# Patient Record
Sex: Female | Born: 1945 | Race: White | Hispanic: No | Smoking: Never smoker
Health system: Southern US, Community
[De-identification: ages and names within clinical notes are randomized; demographics above are authoritative.]

## PROBLEM LIST (undated history)

## (undated) DIAGNOSIS — F329 Major depressive disorder, single episode, unspecified: Secondary | ICD-10-CM

## (undated) DIAGNOSIS — B029 Zoster without complications: Secondary | ICD-10-CM

## (undated) DIAGNOSIS — F419 Anxiety disorder, unspecified: Secondary | ICD-10-CM

## (undated) DIAGNOSIS — F32A Depression, unspecified: Secondary | ICD-10-CM

## (undated) DIAGNOSIS — M1990 Unspecified osteoarthritis, unspecified site: Secondary | ICD-10-CM

## (undated) DIAGNOSIS — R079 Chest pain, unspecified: Secondary | ICD-10-CM

## (undated) DIAGNOSIS — C801 Malignant (primary) neoplasm, unspecified: Secondary | ICD-10-CM

## (undated) DIAGNOSIS — R9431 Abnormal electrocardiogram [ECG] [EKG]: Secondary | ICD-10-CM

## (undated) HISTORY — PX: FOOT SURGERY: SHX648

## (undated) HISTORY — PX: CHOLECYSTECTOMY: SHX55

## (undated) HISTORY — PX: OTHER SURGICAL HISTORY: SHX169

## (undated) HISTORY — PX: EYE SURGERY: SHX253

## (undated) HISTORY — PX: SKIN CANCER EXCISION: SHX779

## (undated) HISTORY — PX: COLONOSCOPY: SHX174

## (undated) HISTORY — PX: GALLBLADDER SURGERY: SHX652

## (undated) HISTORY — PX: BUNIONECTOMY: SHX129

---

## 2004-09-05 ENCOUNTER — Ambulatory Visit: Payer: Self-pay

## 2006-10-29 ENCOUNTER — Ambulatory Visit: Payer: Self-pay

## 2007-12-09 ENCOUNTER — Ambulatory Visit: Payer: Self-pay

## 2008-05-20 ENCOUNTER — Inpatient Hospital Stay: Payer: Self-pay | Admitting: Surgery

## 2008-05-20 ENCOUNTER — Other Ambulatory Visit: Payer: Self-pay

## 2008-08-21 ENCOUNTER — Ambulatory Visit: Payer: Self-pay | Admitting: Unknown Physician Specialty

## 2009-12-24 ENCOUNTER — Ambulatory Visit: Payer: Self-pay

## 2011-08-18 ENCOUNTER — Ambulatory Visit: Payer: Self-pay

## 2012-11-01 ENCOUNTER — Ambulatory Visit: Payer: Self-pay | Admitting: Family Medicine

## 2013-07-25 ENCOUNTER — Ambulatory Visit: Payer: Self-pay

## 2013-08-22 ENCOUNTER — Ambulatory Visit: Payer: Self-pay | Admitting: Unknown Physician Specialty

## 2013-08-22 HISTORY — PX: SKIN CANCER EXCISION: SHX779

## 2013-08-22 LAB — HM COLONOSCOPY

## 2013-08-25 LAB — PATHOLOGY REPORT

## 2013-10-27 ENCOUNTER — Ambulatory Visit (INDEPENDENT_AMBULATORY_CARE_PROVIDER_SITE_OTHER): Payer: 59 | Admitting: Podiatry

## 2013-10-27 ENCOUNTER — Encounter: Payer: Self-pay | Admitting: Podiatry

## 2013-10-27 VITALS — BP 139/84 | HR 82 | Resp 16 | Ht 64.0 in | Wt 160.0 lb

## 2013-10-27 DIAGNOSIS — M722 Plantar fascial fibromatosis: Secondary | ICD-10-CM

## 2013-10-27 MED ORDER — MELOXICAM 15 MG PO TABS: 15.0000 mg | ORAL_TABLET | Freq: Every day | ORAL | Status: DC

## 2013-10-27 MED ORDER — METHYLPREDNISOLONE (PAK) 4 MG PO TABS: ORAL_TABLET | ORAL | Status: DC

## 2013-10-27 NOTE — Patient Instructions (Signed)
Plantar Fasciitis (Heel Spur Syndrome) with Rehab The plantar fascia is a fibrous, ligament-like, soft-tissue structure that spans the bottom of the foot. Plantar fasciitis is a condition that causes pain in the foot due to inflammation of the tissue. SYMPTOMS   Pain and tenderness on the underneath side of the foot.  Pain that worsens with standing or walking. CAUSES  Plantar fasciitis is caused by irritation and injury to the plantar fascia on the underneath side of the foot. Common mechanisms of injury include:  Direct trauma to bottom of the foot.  Damage to a small nerve that runs under the foot where the main fascia attaches to the heel bone.  Stress placed on the plantar fascia due to bone spurs. RISK INCREASES WITH:   Activities that place stress on the plantar fascia (running, jumping, pivoting, or cutting).  Poor strength and flexibility.  Improperly fitted shoes.  Tight calf muscles.  Flat feet.  Failure to warm-up properly before activity.  Obesity. PREVENTION  Warm up and stretch properly before activity.  Allow for adequate recovery between workouts.  Maintain physical fitness:  Strength, flexibility, and endurance.  Cardiovascular fitness.  Maintain a health body weight.  Avoid stress on the plantar fascia.  Wear properly fitted shoes, including arch supports for individuals who have flat feet. PROGNOSIS  If treated properly, then the symptoms of plantar fasciitis usually resolve without surgery. However, occasionally surgery is necessary. RELATED COMPLICATIONS   Recurrent symptoms that may result in a chronic condition.  Problems of the lower back that are caused by compensating for the injury, such as limping.  Pain or weakness of the foot during push-off following surgery.  Chronic inflammation, scarring, and partial or complete fascia tear, occurring more often from repeated injections. TREATMENT  Treatment initially involves the use of  ice and medication to help reduce pain and inflammation. The use of strengthening and stretching exercises may help reduce pain with activity, especially stretches of the Achilles tendon. These exercises may be performed at home or with a therapist. Your caregiver may recommend that you use heel cups of arch supports to help reduce stress on the plantar fascia. Occasionally, corticosteroid injections are given to reduce inflammation. If symptoms persist for greater than 6 months despite non-surgical (conservative), then surgery may be recommended.  MEDICATION   If pain medication is necessary, then nonsteroidal anti-inflammatory medications, such as aspirin and ibuprofen, or other minor pain relievers, such as acetaminophen, are often recommended.  Do not take pain medication within 7 days before surgery.  Prescription pain relievers may be given if deemed necessary by your caregiver. Use only as directed and only as much as you need.  Corticosteroid injections may be given by your caregiver. These injections should be reserved for the most serious cases, because they may only be given a certain number of times. HEAT AND COLD  Cold treatment (icing) relieves pain and reduces inflammation. Cold treatment should be applied for 10 to 15 minutes every 2 to 3 hours for inflammation and pain and immediately after any activity that aggravates your symptoms. Use ice packs or massage the area with a piece of ice (ice massage).  Heat treatment may be used prior to performing the stretching and strengthening activities prescribed by your caregiver, physical therapist, or athletic trainer. Use a heat pack or soak the injury in warm water. SEEK IMMEDIATE MEDICAL CARE IF:  Treatment seems to offer no benefit, or the condition worsens.  Any medications produce adverse side effects. EXERCISES RANGE   OF MOTION (ROM) AND STRETCHING EXERCISES - Plantar Fasciitis (Heel Spur Syndrome) These exercises may help you  when beginning to rehabilitate your injury. Your symptoms may resolve with or without further involvement from your physician, physical therapist or athletic trainer. While completing these exercises, remember:   Restoring tissue flexibility helps normal motion to return to the joints. This allows healthier, less painful movement and activity.  An effective stretch should be held for at least 30 seconds.  A stretch should never be painful. You should only feel a gentle lengthening or release in the stretched tissue. RANGE OF MOTION - Toe Extension, Flexion  Sit with your right / left leg crossed over your opposite knee.  Grasp your toes and gently pull them back toward the top of your foot. You should feel a stretch on the bottom of your toes and/or foot.  Hold this stretch for __________ seconds.  Now, gently pull your toes toward the bottom of your foot. You should feel a stretch on the top of your toes and or foot.  Hold this stretch for __________ seconds. Repeat __________ times. Complete this stretch __________ times per day.  RANGE OF MOTION - Ankle Dorsiflexion, Active Assisted  Remove shoes and sit on a chair that is preferably not on a carpeted surface.  Place right / left foot under knee. Extend your opposite leg for support.  Keeping your heel down, slide your right / left foot back toward the chair until you feel a stretch at your ankle or calf. If you do not feel a stretch, slide your bottom forward to the edge of the chair, while still keeping your heel down.  Hold this stretch for __________ seconds. Repeat __________ times. Complete this stretch __________ times per day.  STRETCH  Gastroc, Standing  Place hands on wall.  Extend right / left leg, keeping the front knee somewhat bent.  Slightly point your toes inward on your back foot.  Keeping your right / left heel on the floor and your knee straight, shift your weight toward the wall, not allowing your back to  arch.  You should feel a gentle stretch in the right / left calf. Hold this position for __________ seconds. Repeat __________ times. Complete this stretch __________ times per day. STRETCH  Soleus, Standing  Place hands on wall.  Extend right / left leg, keeping the other knee somewhat bent.  Slightly point your toes inward on your back foot.  Keep your right / left heel on the floor, bend your back knee, and slightly shift your weight over the back leg so that you feel a gentle stretch deep in your back calf.  Hold this position for __________ seconds. Repeat __________ times. Complete this stretch __________ times per day. STRETCH  Gastrocsoleus, Standing  Note: This exercise can place a lot of stress on your foot and ankle. Please complete this exercise only if specifically instructed by your caregiver.   Place the ball of your right / left foot on a step, keeping your other foot firmly on the same step.  Hold on to the wall or a rail for balance.  Slowly lift your other foot, allowing your body weight to press your heel down over the edge of the step.  You should feel a stretch in your right / left calf.  Hold this position for __________ seconds.  Repeat this exercise with a slight bend in your right / left knee. Repeat __________ times. Complete this stretch __________ times per day.    STRENGTHENING EXERCISES - Plantar Fasciitis (Heel Spur Syndrome)  These exercises may help you when beginning to rehabilitate your injury. They may resolve your symptoms with or without further involvement from your physician, physical therapist or athletic trainer. While completing these exercises, remember:   Muscles can gain both the endurance and the strength needed for everyday activities through controlled exercises.  Complete these exercises as instructed by your physician, physical therapist or athletic trainer. Progress the resistance and repetitions only as guided. STRENGTH - Towel  Curls  Sit in a chair positioned on a non-carpeted surface.  Place your foot on a towel, keeping your heel on the floor.  Pull the towel toward your heel by only curling your toes. Keep your heel on the floor.  If instructed by your physician, physical therapist or athletic trainer, add ____________________ at the end of the towel. Repeat __________ times. Complete this exercise __________ times per day. STRENGTH - Ankle Inversion  Secure one end of a rubber exercise band/tubing to a fixed object (table, pole). Loop the other end around your foot just before your toes.  Place your fists between your knees. This will focus your strengthening at your ankle.  Slowly, pull your big toe up and in, making sure the band/tubing is positioned to resist the entire motion.  Hold this position for __________ seconds.  Have your muscles resist the band/tubing as it slowly pulls your foot back to the starting position. Repeat __________ times. Complete this exercises __________ times per day.  Document Released: 09/01/2005 Document Revised: 11/24/2011 Document Reviewed: 12/14/2008 [employer] Patient Information 2014 [employer]. Plantar Fasciitis Plantar fasciitis is a common condition that causes foot pain. It is soreness (inflammation) of the band of tough fibrous tissue on the bottom of the foot that runs from the heel bone (calcaneus) to the ball of the foot. The cause of this soreness may be from excessive standing, poor fitting shoes, running on hard surfaces, being overweight, having an abnormal walk, or overuse (this is common in runners) of the painful foot or feet. It is also common in aerobic exercise dancers and ballet dancers. SYMPTOMS  Most people with plantar fasciitis complain of:  Severe pain in the morning on the bottom of their foot especially when taking the first steps out of bed. This pain recedes after a few minutes of walking.  Severe pain is experienced also during walking  following a long period of inactivity.  Pain is worse when walking barefoot or up stairs DIAGNOSIS   Your caregiver will diagnose this condition by examining and feeling your foot.  Special tests such as X-rays of your foot, are usually not needed. PREVENTION   Consult a sports medicine professional before beginning a new exercise program.  Walking programs offer a good workout. With walking there is a lower chance of overuse injuries common to runners. There is less impact and less jarring of the joints.  Begin all new exercise programs slowly. If problems or pain develop, decrease the amount of time or distance until you are at a comfortable level.  Wear good shoes and replace them regularly.  Stretch your foot and the heel cords at the back of the ankle (Achilles tendon) both before and after exercise.  Run or exercise on even surfaces that are not hard. For example, asphalt is better than pavement.  Do not run barefoot on hard surfaces.  If using a treadmill, vary the incline.  Do not continue to workout if you have foot or joint   problems. Seek professional help if they do not improve. HOME CARE INSTRUCTIONS   Avoid activities that cause you pain until you recover.  Use ice or cold packs on the problem or painful areas after working out.  Only take over-the-counter or prescription medicines for pain, discomfort, or fever as directed by your caregiver.  Soft shoe inserts or athletic shoes with air or gel sole cushions may be helpful.  If problems continue or become more severe, consult a sports medicine caregiver or your own health care provider. Cortisone is a potent anti-inflammatory medication that may be injected into the painful area. You can discuss this treatment with your caregiver. MAKE SURE YOU:   Understand these instructions.  Will watch your condition.  Will get help right away if you are not doing well or get worse. Document Released: 05/27/2001 Document  Revised: 11/24/2011 Document Reviewed: 07/26/2008 [facility] Patient Information 2014 [place], [place]. Plantar Fasciitis Plantar fasciitis is a common condition that causes foot pain. It is soreness (inflammation) of the band of tough fibrous tissue on the bottom of the foot that runs from the heel bone (calcaneus) to the ball of the foot. The cause of this soreness may be from excessive standing, poor fitting shoes, running on hard surfaces, being overweight, having an abnormal walk, or overuse (this is common in runners) of the painful foot or feet. It is also common in aerobic exercise dancers and ballet dancers. SYMPTOMS  Most people with plantar fasciitis complain of:  Severe pain in the morning on the bottom of their foot especially when taking the first steps out of bed. This pain recedes after a few minutes of walking.  Severe pain is experienced also during walking following a long period of inactivity.  Pain is worse when walking barefoot or up stairs DIAGNOSIS   Your caregiver will diagnose this condition by examining and feeling your foot.  Special tests such as X-rays of your foot, are usually not needed. PREVENTION   Consult a sports medicine professional before beginning a new exercise program.  Walking programs offer a good workout. With walking there is a lower chance of overuse injuries common to runners. There is less impact and less jarring of the joints.  Begin all new exercise programs slowly. If problems or pain develop, decrease the amount of time or distance until you are at a comfortable level.  Wear good shoes and replace them regularly.  Stretch your foot and the heel cords at the back of the ankle (Achilles tendon) both before and after exercise.  Run or exercise on even surfaces that are not hard. For example, asphalt is better than pavement.  Do not run barefoot on hard surfaces.  If using a treadmill, vary the incline.  Do not continue to workout if  you have foot or joint problems. Seek professional help if they do not improve. HOME CARE INSTRUCTIONS   Avoid activities that cause you pain until you recover.  Use ice or cold packs on the problem or painful areas after working out.  Only take over-the-counter or prescription medicines for pain, discomfort, or fever as directed by your caregiver.  Soft shoe inserts or athletic shoes with air or gel sole cushions may be helpful.  If problems continue or become more severe, consult a sports medicine caregiver or your own health care provider. Cortisone is a potent anti-inflammatory medication that may be injected into the painful area. You can discuss this treatment with your caregiver. MAKE SURE YOU:   Understand  these instructions.  Will watch your condition.  Will get help right away if you are not doing well or get worse. Document Released: 05/27/2001 Document Revised: 11/24/2011 Document Reviewed: 07/26/2008 [facility] Patient Information 2014 [place], [place].

## 2013-10-27 NOTE — Progress Notes (Signed)
Yvette Rogers presents today with a chief complaint of pain to her bilateral heels. She states this been going on for quite some time she denies fever chills nausea vomiting muscle aches and pains and trauma to the foot.  Objective: I have reviewed her past medical history medications allergies surgeries and social history. Pulses are palpable bilateral. She has pain on direct palpation medial continued tubercles bilaterally consistent with plantar fasciitis.  Assessment: Plantar fasciitis left greater than right.  Plan: Injected her left heel today. Sterapred Dosepak to be followed by Mobic was prescribed. Put her in a night splint in a plantar fascial strapping. Discussed appropriate shoe gear stretching exercises and ice therapy. I will followup with her in one month.

## 2013-11-24 ENCOUNTER — Ambulatory Visit: Payer: Self-pay | Admitting: Podiatry

## 2013-12-12 ENCOUNTER — Ambulatory Visit (INDEPENDENT_AMBULATORY_CARE_PROVIDER_SITE_OTHER): Payer: 59 | Admitting: Podiatry

## 2013-12-12 ENCOUNTER — Encounter: Payer: Self-pay | Admitting: Podiatry

## 2013-12-12 VITALS — BP 76/63 | HR 78 | Resp 16

## 2013-12-12 DIAGNOSIS — M722 Plantar fascial fibromatosis: Secondary | ICD-10-CM

## 2013-12-12 NOTE — Progress Notes (Signed)
She presents today for followup of her plantar fasciitis. She states that is approximately 60% better as she refers to the left foot.  Objective: Vital signs are stable she is alert and oriented x3 pulses are palpable bilateral. She has pain on palpation medial continued tubercle the left heel as well as the right heel now.  Assessment: Plantar fasciitis bilateral left greater than right.  Plan: Continue all conservative methods to decrease inflammation we also injected the bilateral heels today with Kenalog and local anesthetic.

## 2014-01-09 ENCOUNTER — Ambulatory Visit (INDEPENDENT_AMBULATORY_CARE_PROVIDER_SITE_OTHER): Payer: 59 | Admitting: Podiatry

## 2014-01-09 VITALS — Resp 16 | Ht 65.0 in | Wt 160.0 lb

## 2014-01-09 DIAGNOSIS — M722 Plantar fascial fibromatosis: Secondary | ICD-10-CM

## 2014-01-09 NOTE — Progress Notes (Signed)
She presents today for followup of her plantar fasciitis bilateral. She states that they're only burning a little bit now but it'll hurt quite as bad as they were.  Objective: Vital signs are stable she is alert and oriented x3. She has pain on palpation medial continued tubercles bilateral.  Assessment: Plantar fasciitis with neuritis bilateral.  Plan: She was scan today for orthotics and she will continue all conservative therapies. I will followup with her once those come in.

## 2014-01-20 ENCOUNTER — Encounter: Payer: Self-pay | Admitting: *Deleted

## 2014-01-20 NOTE — Progress Notes (Signed)
Sent pt post card letting her know orthotics are here. 

## 2014-01-30 ENCOUNTER — Ambulatory Visit (INDEPENDENT_AMBULATORY_CARE_PROVIDER_SITE_OTHER): Payer: 59 | Admitting: Podiatry

## 2014-01-30 ENCOUNTER — Encounter: Payer: Self-pay | Admitting: Podiatry

## 2014-01-30 DIAGNOSIS — M722 Plantar fascial fibromatosis: Secondary | ICD-10-CM

## 2014-01-30 NOTE — Patient Instructions (Signed)

## 2014-01-30 NOTE — Progress Notes (Signed)
Pt presents for orthotic pick up , written and verbal instructions giving

## 2014-03-06 ENCOUNTER — Ambulatory Visit: Payer: 59 | Admitting: Podiatry

## 2014-05-29 ENCOUNTER — Ambulatory Visit: Payer: 59 | Admitting: Podiatry

## 2014-05-29 VITALS — BP 132/80 | HR 84 | Resp 16

## 2014-05-29 DIAGNOSIS — M722 Plantar fascial fibromatosis: Secondary | ICD-10-CM

## 2014-05-29 DIAGNOSIS — M79609 Pain in unspecified limb: Secondary | ICD-10-CM

## 2014-05-29 DIAGNOSIS — M205X2 Other deformities of toe(s) (acquired), left foot: Secondary | ICD-10-CM

## 2014-05-29 DIAGNOSIS — M7989 Other specified soft tissue disorders: Secondary | ICD-10-CM

## 2014-05-29 DIAGNOSIS — M2010 Hallux valgus (acquired), unspecified foot: Secondary | ICD-10-CM

## 2014-05-29 DIAGNOSIS — M21619 Bunion of unspecified foot: Secondary | ICD-10-CM

## 2014-05-29 DIAGNOSIS — M205X9 Other deformities of toe(s) (acquired), unspecified foot: Secondary | ICD-10-CM

## 2014-05-29 DIAGNOSIS — M799 Soft tissue disorder, unspecified: Secondary | ICD-10-CM

## 2014-05-29 NOTE — Progress Notes (Signed)
She presents today for followup of her plantar fasciitis of her left foot and a painful bunion deformity of her left foot as well. She also states that she has an area overlying the dorsal aspect of the second toe which is painful particularly with flip flops and sandals. She's also having plate pain about the fifth metatarsophalangeal joints and she has a Taylor's bunion deformity bilateral foot. She denies any change in her past medical history medications allergies surgeries and social history. She denies any cardiac issues any new allergies.  Objective: Vital signs are stable she is alert and oriented x3. She has pain on palpation medial continued tubercle the left heel. She has pain on end range of motion of the first metatarsophalangeal joint of the left foot. 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 At this point she also has pain on palpation fifth metatarsal phalangeal joint bilateral with Taylor's bunion deformities. She has a soft tissue lesion that appears to extend down to the extensor tendon of the second toe. It is nonerythematous is nonedematous is nonpulsatile it does not feel as if it is a ganglion or anything of major concern other than tenderness.  Assessment: Pain in limb secondary to chronic intractable plantar fasciitis left. Hallux limitus first metatarsophalangeal joint left. Taylor's bunion deformities bilateral. Plantar fasciitis bilateral left greater than right soft tissue lesion dorsal aspect of the second toe left.  Plan: Discussed etiology pathology conservative versus surgical therapies. At this point surgical consent was performed consisting of a Baltazar Apo with osteotomy with screw. Fifth metatarsal osteotomies bilateral with screws. Excision soft tissue lesion dorsal aspect second toe left. Endoscopic plantar fasciotomy left foot. I went over consent form today line  bylined number by number giving her ample time to ask questions she saw fit regarding this procedure I answered them to the best of my ability in layman's terms she understood it was amenable to it and signed all 3 pages of the consent form. I will followup with her November for surgical intervention.

## 2014-06-01 ENCOUNTER — Telehealth: Payer: Self-pay | Admitting: *Deleted

## 2014-06-01 NOTE — Telephone Encounter (Signed)
Called and left message for pt to return my call because she would like to r/s surgery.

## 2014-06-20 DIAGNOSIS — M79609 Pain in unspecified limb: Secondary | ICD-10-CM

## 2014-06-20 LAB — LIPID PANEL
Cholesterol: 225 mg/dL — AB (ref 0–200)
HDL: 66 mg/dL (ref 35–70)
LDL Cholesterol: 131 mg/dL
LDl/HDL Ratio: 2
TRIGLYCERIDES: 141 mg/dL (ref 40–160)

## 2014-06-20 LAB — HEPATIC FUNCTION PANEL
ALT: 10 U/L (ref 7–35)
AST: 18 U/L (ref 13–35)
Alkaline Phosphatase: 90 U/L (ref 25–125)
BILIRUBIN, TOTAL: 0.3 mg/dL

## 2014-06-20 LAB — CBC AND DIFFERENTIAL
HCT: 39 % (ref 36–46)
Hemoglobin: 13.2 g/dL (ref 12.0–16.0)
Neutrophils Absolute: 3 /uL
PLATELETS: 249 10*3/uL (ref 150–399)
WBC: 5.2 10*3/mL

## 2014-06-20 LAB — BASIC METABOLIC PANEL
BUN: 12 mg/dL (ref 4–21)
Creatinine: 0.7 mg/dL (ref 0.5–1.1)
Glucose: 84 mg/dL
Potassium: 4.8 mmol/L (ref 3.4–5.3)
Sodium: 139 mmol/L (ref 137–147)

## 2014-06-20 LAB — TSH: TSH: 3.05 u[IU]/mL (ref 0.41–5.90)

## 2014-06-21 ENCOUNTER — Other Ambulatory Visit: Payer: Self-pay | Admitting: Podiatry

## 2014-06-21 MED ORDER — OXYCODONE-ACETAMINOPHEN 10-325 MG PO TABS: ORAL_TABLET | ORAL | Status: DC

## 2014-06-21 MED ORDER — PROMETHAZINE HCL 25 MG PO TABS: 25.0000 mg | ORAL_TABLET | Freq: Three times a day (TID) | ORAL | Status: DC | PRN

## 2014-06-21 MED ORDER — CLINDAMYCIN HCL 150 MG PO CAPS: 150.0000 mg | ORAL_CAPSULE | Freq: Three times a day (TID) | ORAL | Status: DC

## 2014-06-23 ENCOUNTER — Encounter: Payer: Self-pay | Admitting: Podiatry

## 2014-06-23 DIAGNOSIS — M722 Plantar fascial fibromatosis: Secondary | ICD-10-CM

## 2014-06-23 DIAGNOSIS — M2012 Hallux valgus (acquired), left foot: Secondary | ICD-10-CM

## 2014-06-23 DIAGNOSIS — S91302S Unspecified open wound, left foot, sequela: Secondary | ICD-10-CM

## 2014-06-23 DIAGNOSIS — M21542 Acquired clubfoot, left foot: Secondary | ICD-10-CM

## 2014-06-23 DIAGNOSIS — M21541 Acquired clubfoot, right foot: Secondary | ICD-10-CM

## 2014-06-28 ENCOUNTER — Ambulatory Visit (INDEPENDENT_AMBULATORY_CARE_PROVIDER_SITE_OTHER): Payer: 59 | Admitting: Podiatry

## 2014-06-28 ENCOUNTER — Ambulatory Visit (INDEPENDENT_AMBULATORY_CARE_PROVIDER_SITE_OTHER): Payer: 59

## 2014-06-28 VITALS — BP 112/75 | HR 85 | Temp 97.6°F | Resp 16

## 2014-06-28 DIAGNOSIS — M21611 Bunion of right foot: Secondary | ICD-10-CM

## 2014-06-28 DIAGNOSIS — Z9889 Other specified postprocedural states: Secondary | ICD-10-CM

## 2014-06-28 DIAGNOSIS — M2011 Hallux valgus (acquired), right foot: Secondary | ICD-10-CM

## 2014-06-28 MED ORDER — OXYCODONE-ACETAMINOPHEN 10-325 MG PO TABS: 1.0000 | ORAL_TABLET | Freq: Four times a day (QID) | ORAL | Status: DC | PRN

## 2014-06-28 MED ORDER — PROMETHAZINE HCL 25 MG PO TABS: 25.0000 mg | ORAL_TABLET | Freq: Three times a day (TID) | ORAL | Status: DC | PRN

## 2014-06-28 NOTE — Progress Notes (Signed)
Yvette Rogers presents today for postop visit date of surgery was 06/23/2014. She denies fever chills nausea vomiting muscle aches and pains. She presents today with a Cam Walker to her left foot and a Darco to the right foot.  Objective: Vital signs are stable she is alert and oriented x3 dry sterile dressing was intact once removed reveals fifth metatarsal osteotomy bilateral and EPF left tendon repair second left and an Austin bunion repair left. There is mild edema no erythema no cellulitis drainage or odor. Radiographic evaluation demonstrates capital osteotomy is in her in good position with good internal fixation. I see no signs of gas in the tissues.  Assessment: Well-healing surgical foot bilateral.  Plan: Redressed today with a dry sterile compressive dressing she will continue to keep these dry and stay off of them as much as possible. She will keep it elevated. Followup with her in one week.

## 2014-06-30 NOTE — Progress Notes (Signed)
1. Yvette Rogers BNION REPAIR WITH SCREW LEFT 2. 5TH METATARSAL OSTEOTOMY WITH SCREWS BOTH FEET 3. ENDOSCOPIC PLANTAR FASCIOTOMY LEFT FOOT 4. EXCISION SOFT TISSUE LESION LEFT 2ND TOE 5. CORTISONE INJECTION RIGHT HEEL  DOS 10.9.15

## 2014-07-05 ENCOUNTER — Ambulatory Visit (INDEPENDENT_AMBULATORY_CARE_PROVIDER_SITE_OTHER): Payer: 59 | Admitting: Podiatry

## 2014-07-05 VITALS — BP 110/62 | HR 89 | Temp 97.6°F | Resp 16

## 2014-07-05 DIAGNOSIS — Z9889 Other specified postprocedural states: Secondary | ICD-10-CM

## 2014-07-05 DIAGNOSIS — M722 Plantar fascial fibromatosis: Secondary | ICD-10-CM

## 2014-07-05 DIAGNOSIS — M2011 Hallux valgus (acquired), right foot: Secondary | ICD-10-CM

## 2014-07-05 NOTE — Progress Notes (Signed)
She presents today 2 weeks status post fifth metatarsal osteotomies bilateral EPF left removal of a soft tissue mass dorsal aspect second digit left and an Austin bunion repair with screw left. She states that she's doing great she hardly has any symptoms at all she states she may have overdone it is a little bit resulting in some swelling in her left foot over the past couple of days.  Objective: Vital signs are stable she is alert and oriented x3. Margins are well coapted there is no erythema does mild edema no cellulitis drainage or odor. Sutures were removed today margins remain well coapted. She has good range of motion of the first metatarsophalangeal joint bilateral and fifth metatarsophalangeal joints bilateral. She has no pain on palpation of the EPF site left.  Assessment: Well-healing surgical feet bilateral.  Plan: Compression anklet as were dispensed today and another Darco shoe was dispensed. I will allow her to wash her feet and get these wet. I encouraged range of motion exercises and massage therapy. She will also apply lotion and rehydrate her skin. I will followup with her in 2 weeks for another set of x-rays. At that time hopefully we will be able to get her back into her regular pair shoes.

## 2014-07-11 ENCOUNTER — Encounter: Payer: Self-pay | Admitting: Podiatry

## 2014-07-17 ENCOUNTER — Ambulatory Visit (INDEPENDENT_AMBULATORY_CARE_PROVIDER_SITE_OTHER): Payer: 59 | Admitting: Podiatry

## 2014-07-17 ENCOUNTER — Ambulatory Visit (INDEPENDENT_AMBULATORY_CARE_PROVIDER_SITE_OTHER): Payer: 59

## 2014-07-17 VITALS — BP 116/73 | HR 89 | Resp 16

## 2014-07-17 DIAGNOSIS — M21611 Bunion of right foot: Secondary | ICD-10-CM

## 2014-07-17 DIAGNOSIS — M2012 Hallux valgus (acquired), left foot: Secondary | ICD-10-CM

## 2014-07-17 DIAGNOSIS — Z9889 Other specified postprocedural states: Secondary | ICD-10-CM

## 2014-07-17 DIAGNOSIS — M2011 Hallux valgus (acquired), right foot: Secondary | ICD-10-CM

## 2014-07-17 NOTE — Progress Notes (Signed)
She presents today approximately one-month status post fifth metatarsal osteotomies bilateral. Endoscopic plantar fasciotomy left foot. Austin bunion repair with screw left foot. She states that she is doing okay but they're a little tender. She states that she is having more pain on the plantar left heel and the lateral aspect of the foot at the fifth metatarsal base. She denies fever chills nausea vomiting muscle aches and pains.  Objective: Pulses are strongly palpable bilateral. Neurologic sensorium is intact. Minimal edema about the bilateral foot. Incision sites. Be healing well. She has some deep scar tissue at the endoscopic plantar fasciotomy site. Otherwise she has great range of motion of all surgical joints.  Assessment: Well-healing surgical foot bilateral. Endoscopic plantar fasciotomy site with scar tissue.  Plan: Encouraged her to perform range of motion exercises and massage therapy. We will let her back into her regular shoe gear and I will follow-up with her in 1 month.

## 2014-07-26 ENCOUNTER — Encounter: Payer: 59 | Admitting: Podiatry

## 2014-07-27 ENCOUNTER — Ambulatory Visit: Payer: Self-pay

## 2014-07-27 LAB — HM MAMMOGRAPHY

## 2014-08-07 ENCOUNTER — Encounter: Payer: Self-pay | Admitting: Podiatry

## 2014-08-07 ENCOUNTER — Ambulatory Visit (INDEPENDENT_AMBULATORY_CARE_PROVIDER_SITE_OTHER): Payer: 59 | Admitting: Podiatry

## 2014-08-07 ENCOUNTER — Ambulatory Visit (INDEPENDENT_AMBULATORY_CARE_PROVIDER_SITE_OTHER): Payer: 59

## 2014-08-07 VITALS — BP 117/64 | HR 83 | Resp 16

## 2014-08-07 DIAGNOSIS — Z9889 Other specified postprocedural states: Secondary | ICD-10-CM

## 2014-08-07 NOTE — Progress Notes (Signed)
She presents today for follow-up of her bilateral foot surgery. Fifth metatarsal osteotomies bilateral Austin bunion repair left. Date of surgery 06/23/2014. She states that she has heel pain to the right heel.  Objective: Vital signs are stable she is alert and oriented 3. Mild edema about the left foot. No edema over the right foot. Radiographic evaluation demonstrates well-healing surgical foot bilateral good range of motion first metatarsophalangeal joint left and fifth metatarsophalangeal joints bilateral. Operative pain on palpation medial calcaneal tubercle of the right heel. Range of motion about the first metatarsophalangeal joint is good without crepitation but it is restricted.  Assessment: Chronic intractable plantar fasciitis right heel. Well-healing surgical foot bilateral.  Plan: Get back into a pair of closed heel tennis shoes and I will follow-up with her in 1 month. Due to the heel pain and the lack of motion at the first metatarsophalangeal joint and going crease her time out of work for at least another month. I encouraged range of motion exercises and physical therapy. This should get her back to work by the first of the year.

## 2014-08-17 ENCOUNTER — Telehealth: Payer: Self-pay | Admitting: *Deleted

## 2014-08-17 NOTE — Telephone Encounter (Signed)
CALLED AND LEFT MESSAGE LETTING Yvette Rogers KNOW INFO WAS SENT OVER TO [facility]  ATTN TANESHA DANRIDGE AT [phone] REGARDING HER LAST NOTE IN THE OFFICE OF 11.23.15.

## 2014-09-06 ENCOUNTER — Encounter: Payer: 59 | Admitting: Podiatry

## 2014-09-12 ENCOUNTER — Ambulatory Visit (INDEPENDENT_AMBULATORY_CARE_PROVIDER_SITE_OTHER): Payer: 59

## 2014-09-12 ENCOUNTER — Encounter: Payer: Self-pay | Admitting: Podiatry

## 2014-09-12 ENCOUNTER — Ambulatory Visit (INDEPENDENT_AMBULATORY_CARE_PROVIDER_SITE_OTHER): Payer: 59 | Admitting: Podiatry

## 2014-09-12 ENCOUNTER — Encounter: Payer: 59 | Admitting: Podiatry

## 2014-09-12 VITALS — BP 107/73 | HR 99 | Resp 16

## 2014-09-12 DIAGNOSIS — Z09 Encounter for follow-up examination after completed treatment for conditions other than malignant neoplasm: Secondary | ICD-10-CM

## 2014-09-12 MED ORDER — MELOXICAM 7.5 MG PO TABS: 7.5000 mg | ORAL_TABLET | Freq: Every day | ORAL | Status: DC

## 2014-09-12 NOTE — Progress Notes (Signed)
Patient ID: Yvette Rogers, female   DOB: [birth year], 68 y.o.   MRN: [id number]  Subjective:  68 year old female returns to the office for follow up evaluation s/p 5th metatarsal osteotomies, bilatearl EPF, left removal of soft tissue mass, Austin with screw fixation left.  She states that she has edema over bilateral feet as well as some discomfort mostly on the outside aspects of the foot for which she points to the 5th metatarsal head area. She states that approximately 4-5 weeks ago she did fall. She states that she gets intermittent pain to both of her heels. She has recently had a steroid injection, although she states they do not help. She has continued with response and icing. No other acute injury to the area. No other complaints and no acute changes since last appointment. Denies any systemic complaints such as fevers, chills, nausea, vomiting.   Objective:  AAO 3, NAD DP/PT pulses palpable bilaterally, CRT less than 3 seconds Protective sensation intact with Simms Weinstein monofilament, vibratory sensation intact. There is mild edema overlying the fifth metatarsal head bilaterally. There is mild edema overlying the first MTPJ the site of the prior bunionectomy on the left. There is no tenderness to palpation or pain with range of motion of the first MTPJ. There is mild tenderness to palpation upon the fifth metatarsal heads bilaterally with a right greater than left. There is no overlying erythema or increase in warmth bilaterally. Currently, there is no tenderness to palpation overlying the plantar medial tubercle of the calcaneus at the insertion the plantar fascia. There is no pain along the course of plantar fascial in the arch of the foot. Plantar fascia appears to be intact. No pain with lateral compression of the calcaneus or pain with vibratory sensation. No pain along the posterior aspect of the calcaneus or along the course/insertion of the Achilles tendon. No overlying edema, erythema,  increase in warmth. No open lesions or pre-ulcerative lesions.  No pain with calf compression, swelling, warmth, erythema  Assessment: 68 year old female status post 5th metatarsal osteotomies, bilatearl EPF, left removal of soft tissue mass, Austin with screw fixation left.  Plan: -X-rays were obtained and reviewed the patient. On the right lateral view there is gapping evident of fifth metatarsal osteotomy site. -Treatment options were discussed with conservative and surgical with the patient including alternatives, risks, complications. -At this time due to the x-ray findings and the regular shoe may recommend immobilization in a Cam Walker. Discussed possible surgical intervention to remove the hardware and for further fixation/reduction. At this time, the patient elects to hold off on surgical intervention. -Currently, there is no symptoms of plantar fasciitis. Recommend to continue night splint, plantar fascial brace, she supportive shoe gear, stretching, icing. When it stretching exercises which avoid pressure to the forefoot. -Prescribed mobic. Discussed side effects the medication with the patient and directed to stop immediately should any occur and call the office. -Follow-up in 2 weeks for repeat x-rays. In the meantime, call the office with any questions, concerns, change in symptoms.

## 2014-09-25 ENCOUNTER — Ambulatory Visit (INDEPENDENT_AMBULATORY_CARE_PROVIDER_SITE_OTHER): Payer: 59

## 2014-09-25 ENCOUNTER — Encounter: Payer: Self-pay | Admitting: Podiatry

## 2014-09-25 ENCOUNTER — Ambulatory Visit (INDEPENDENT_AMBULATORY_CARE_PROVIDER_SITE_OTHER): Payer: 59 | Admitting: Podiatry

## 2014-09-25 VITALS — BP 115/60 | HR 80 | Resp 16

## 2014-09-25 DIAGNOSIS — M2040 Other hammer toe(s) (acquired), unspecified foot: Secondary | ICD-10-CM | POA: Diagnosis not present

## 2014-09-25 DIAGNOSIS — Z9889 Other specified postprocedural states: Secondary | ICD-10-CM

## 2014-09-25 NOTE — Progress Notes (Signed)
She presents today for follow-up of a fifth metatarsal osteotomy bilateral with injury to the fifth metatarsal right foot. She is also complaining of some pain along the dorsal lateral aspect of the left foot fifth metatarsal base.  Objective: Pulses are palpable bilateral mild edema overlying the fifth metatarsal head of the right foot. No palpable screw or pin is present and radiographic evaluation demonstrates bone appears to be healing.  Assessment: Well-healing surgical foot left mild displacement right fifth met osteotomy with screw.  Plan: Follow-up with her in a couple weeks for the dorsolateral aspect of her left foot also for the fifth metatarsal head fracture right foot. A new set of x-rays will be

## 2014-10-30 ENCOUNTER — Ambulatory Visit: Payer: 59 | Admitting: Podiatry

## 2014-11-01 ENCOUNTER — Encounter: Payer: Self-pay | Admitting: Podiatry

## 2014-11-01 ENCOUNTER — Ambulatory Visit (INDEPENDENT_AMBULATORY_CARE_PROVIDER_SITE_OTHER): Payer: 59 | Admitting: Podiatry

## 2014-11-01 ENCOUNTER — Ambulatory Visit (INDEPENDENT_AMBULATORY_CARE_PROVIDER_SITE_OTHER): Payer: 59

## 2014-11-01 DIAGNOSIS — M722 Plantar fascial fibromatosis: Secondary | ICD-10-CM

## 2014-11-01 DIAGNOSIS — M2040 Other hammer toe(s) (acquired), unspecified foot: Secondary | ICD-10-CM

## 2014-11-01 MED ORDER — METHYLPREDNISOLONE (PAK) 4 MG PO TABS: ORAL_TABLET | ORAL | Status: DC

## 2014-11-01 NOTE — Progress Notes (Signed)
Yvette Rogers presents today for follow-up of bilateral foot pain. She states that the surgical procedures appear to have gone very well however she still has moderate to severe heel pain that is relentless and debilitating. She states that she is unable to perform her daily. She states that her heel pain is starting to limit and alter her daily activities and her lifestyle. She has no problems with the surgical sites of the bilateral foot. She is pleased with the outcome. She has tried conservative therapies regarding the heel pain including night splints and anti-inflammatories. She has also changed shoe gear.  Objective: Vital signs are stable she is alert and oriented 3. Pulses are strongly palpable bilateral. She has pain on palpation medial calcaneal tubercles bilateral. Radiographically confirms plantar fasciitis bilateral. Radiographs do not also confirm well-healed first and fifth metatarsal osteotomies bilateral. I see no signs of erythema edema cellulitis drainage or odor. No signs of infection. Pain on palpation medial calcaneal tubercle bilateral.  Plan: Well-healing surgical feet bilateral moderate to severe plantar fasciitis bilateral.  Plan: I offered her an injection which she declined. I placed her on prednisone and referred her to physical therapy. Also placed her out of work for a period of 8 weeks which is subject to change based on patient evaluation in 1 month.

## 2014-11-03 ENCOUNTER — Telehealth: Payer: Self-pay | Admitting: Podiatry

## 2014-11-03 NOTE — Telephone Encounter (Signed)
Pt called and asked if you have gotten her short term disability forms. Please call pt on Monday.

## 2014-11-21 ENCOUNTER — Encounter
Admit: 2014-11-21 | Disposition: A | Discharge status: Home / Self Care | Payer: Self-pay | Attending: Podiatry | Admitting: Podiatry

## 2014-11-29 ENCOUNTER — Encounter: Payer: Self-pay | Admitting: Podiatry

## 2014-11-29 ENCOUNTER — Ambulatory Visit (INDEPENDENT_AMBULATORY_CARE_PROVIDER_SITE_OTHER): Payer: 59 | Admitting: Podiatry

## 2014-11-29 VITALS — BP 122/74 | HR 82 | Resp 16

## 2014-11-29 DIAGNOSIS — M722 Plantar fascial fibromatosis: Secondary | ICD-10-CM

## 2014-11-29 MED ORDER — CELECOXIB 100 MG PO CAPS: 100.0000 mg | ORAL_CAPSULE | Freq: Two times a day (BID) | ORAL | Status: DC

## 2014-11-29 NOTE — Progress Notes (Signed)
She presents today for follow-up of her bilateral foot and ankle pain associated with endoscopic plantar fasciotomies and chronic plantar fasciitis. She is currently performing physical therapy at [facility]. She states that the physical therapist seems 3 working on her left hip more than her foot. She still relates radiating pain from the bilateral foot along the lateral aspect of the foot and up the lateral aspect of each leg. She relates minimal improvement.  Objective: Vital signs are stable she is alert and oriented 3. Pulses are strongly palpable. There is no erythema edema cellulitis drainage or odor. Mild to moderate tenderness on palpation medial calcaneal tubercle bilateral left greater than right.  Assessment: Plantar fasciitis status post EPF bilateral.  Plan: Continue physical therapy. Continue all other conservative therapies including braces strappings and oral anti-inflammatories. I did switch her diclofenac to Celebrex. I will follow-up with her in 3 weeks prior to her returning to work. At this point I do not think that she can return to work safely.

## 2014-12-15 ENCOUNTER — Encounter
Admit: 2014-12-15 | Disposition: A | Discharge status: Home / Self Care | Payer: Self-pay | Attending: Podiatry | Admitting: Podiatry

## 2014-12-20 ENCOUNTER — Encounter: Payer: Self-pay | Admitting: Podiatry

## 2014-12-20 ENCOUNTER — Ambulatory Visit (INDEPENDENT_AMBULATORY_CARE_PROVIDER_SITE_OTHER): Payer: 59 | Admitting: Podiatry

## 2014-12-20 VITALS — BP 135/86 | HR 78 | Resp 16

## 2014-12-20 DIAGNOSIS — M722 Plantar fascial fibromatosis: Secondary | ICD-10-CM | POA: Diagnosis not present

## 2014-12-20 DIAGNOSIS — Z9889 Other specified postprocedural states: Secondary | ICD-10-CM

## 2014-12-20 NOTE — Progress Notes (Signed)
Yvette Rogers presents today for follow-up of her plantar fasciitis. She continues physical therapy and states that she is some better she states that she is unable to stand and walk for long distances.  Objective: Vital signs are stable she is alert and oriented 3 have reviewed her past medical history medications allergies surgeries and social history. I have also reviewed her progress report from physical therapy. She still has severe pain on palpation medial calcaneal tubercles bilateral.  Assessment chronic intractable plantar fasciitis bilateral.  Plan: She will continue physical therapy and we will have to consider dehydrated alcohol injections. I'm recommending that she remain out of work until we can get this under control.

## 2014-12-25 ENCOUNTER — Encounter: Payer: Self-pay | Admitting: Podiatry

## 2015-01-24 ENCOUNTER — Ambulatory Visit (INDEPENDENT_AMBULATORY_CARE_PROVIDER_SITE_OTHER): Payer: 59 | Admitting: Podiatry

## 2015-01-24 ENCOUNTER — Encounter: Payer: Self-pay | Admitting: Podiatry

## 2015-01-24 VITALS — Ht 65.0 in | Wt 170.0 lb

## 2015-01-24 DIAGNOSIS — G5790 Unspecified mononeuropathy of unspecified lower limb: Secondary | ICD-10-CM | POA: Diagnosis not present

## 2015-01-25 NOTE — Progress Notes (Signed)
She presents today with a chief complaint of continued pain to the bilateral heels. All conservative therapies including surgical intervention has failed to render her asymptomatic. 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  Objective: Vital signs are stable alert and oriented 3. Pulses are palpable bilateral. She has severe pain on palpation medial calcaneal tubercles bilateral.  Assessment: Baxter's neuritis/plantar fasciitis bilateral.  Plan: Discussed etiology pathology conservative versus surgical therapies. Injected dehydrated alcohol to the bilateral heels today this is her first injection. Follow up with her in 3 weeks

## 2015-02-14 ENCOUNTER — Encounter: Payer: 59 | Admitting: Podiatry

## 2015-02-19 ENCOUNTER — Ambulatory Visit (INDEPENDENT_AMBULATORY_CARE_PROVIDER_SITE_OTHER): Payer: 59 | Admitting: Podiatry

## 2015-02-19 DIAGNOSIS — M722 Plantar fascial fibromatosis: Secondary | ICD-10-CM

## 2015-02-19 DIAGNOSIS — G5790 Unspecified mononeuropathy of unspecified lower limb: Secondary | ICD-10-CM

## 2015-02-19 NOTE — Progress Notes (Signed)
She presents today for follow-up of her neuritis and plantar fasciitis bilateral heels. She states that she's has seen some improvement.  Objective: Pulses are palpable bilateral. She has pain on palpation medial calcaneal tubercle of her left heel.  Assessment: Neuritis plantar fasciitis bilateral.  Plan: Sclerosing therapy dehydrated alcohol was injected into the bilateral heels today and will follow up with her in 3 weeks for her third dose.

## 2015-03-12 ENCOUNTER — Ambulatory Visit (INDEPENDENT_AMBULATORY_CARE_PROVIDER_SITE_OTHER): Payer: PPO | Admitting: Podiatry

## 2015-03-12 DIAGNOSIS — G5790 Unspecified mononeuropathy of unspecified lower limb: Secondary | ICD-10-CM | POA: Diagnosis not present

## 2015-03-12 NOTE — Progress Notes (Signed)
She presents today after her second injection of dehydrated alcohol bilaterally plantar heels 3 weeks ago. She states the right one may be some better but the left bone really has not improved.  Objective: Vital signs are stable alert and oriented 3 still has tenderness on palpation medial calcaneal tubercles bilaterally.  Assessment: Pain in limb secondary to Baxter's neuritis and plantar fasciitis bilateral.  Plan: Reinjected the bilateral heels today with her third dose of dehydrated alcohol. I will follow-up with her in 1 month

## 2015-03-23 ENCOUNTER — Encounter: Payer: Self-pay | Admitting: Emergency Medicine

## 2015-03-23 DIAGNOSIS — H3530 Unspecified macular degeneration: Secondary | ICD-10-CM | POA: Insufficient documentation

## 2015-03-23 DIAGNOSIS — Z6828 Body mass index (BMI) 28.0-28.9, adult: Secondary | ICD-10-CM | POA: Insufficient documentation

## 2015-03-23 DIAGNOSIS — H1130 Conjunctival hemorrhage, unspecified eye: Secondary | ICD-10-CM | POA: Insufficient documentation

## 2015-03-23 DIAGNOSIS — F0781 Postconcussional syndrome: Secondary | ICD-10-CM | POA: Insufficient documentation

## 2015-03-23 DIAGNOSIS — F32A Depression, unspecified: Secondary | ICD-10-CM | POA: Insufficient documentation

## 2015-03-23 DIAGNOSIS — M659 Synovitis and tenosynovitis, unspecified: Secondary | ICD-10-CM | POA: Insufficient documentation

## 2015-03-23 DIAGNOSIS — F329 Major depressive disorder, single episode, unspecified: Secondary | ICD-10-CM | POA: Insufficient documentation

## 2015-03-23 DIAGNOSIS — F419 Anxiety disorder, unspecified: Secondary | ICD-10-CM | POA: Insufficient documentation

## 2015-04-16 ENCOUNTER — Ambulatory Visit: Payer: 59 | Admitting: Podiatry

## 2015-04-19 ENCOUNTER — Ambulatory Visit: Payer: Self-pay | Admitting: Family Medicine

## 2015-04-24 ENCOUNTER — Ambulatory Visit (INDEPENDENT_AMBULATORY_CARE_PROVIDER_SITE_OTHER): Payer: 59 | Admitting: Family Medicine

## 2015-04-24 VITALS — BP 128/70 | HR 78 | Temp 97.7°F | Resp 16

## 2015-04-24 DIAGNOSIS — F419 Anxiety disorder, unspecified: Secondary | ICD-10-CM

## 2015-04-24 DIAGNOSIS — F329 Major depressive disorder, single episode, unspecified: Secondary | ICD-10-CM

## 2015-04-24 DIAGNOSIS — F32A Depression, unspecified: Secondary | ICD-10-CM

## 2015-04-24 MED ORDER — ALPRAZOLAM 0.5 MG PO TABS: 0.5000 mg | ORAL_TABLET | Freq: Two times a day (BID) | ORAL | Status: DC | PRN

## 2015-04-24 MED ORDER — BUPROPION HCL ER (XL) 300 MG PO TB24: 300.0000 mg | ORAL_TABLET | Freq: Every day | ORAL | Status: DC

## 2015-04-24 NOTE — Progress Notes (Signed)
Patient ID: Yvette Rogers, female   DOB: March 15, 1946, 69 y.o.   MRN: [id number]    Subjective:  HPI  PT is here for a 4 month follow up of Anxiety. She is not taking her Sertraline because she said it made her gain weight. She is taking Wellbutrin 300 mg daily. She reports that she feels better on this as well. She is having a lot of stress in her life and is unsure whether she needs to adjust her medication or not. She also reports that she thinks the Xanax during the day helps better than the Valium because can not take it as much during the day because it makes her sleepy. But she does take it at night. She would like a refill on her Xanax and Wellbutrin today. She does have a lot of stress going on at home.   She also reports that she is having shoulder pain that she thinks is muscle related or from an ols injury. She reports that it has been going on for about a year but has gotten worse over the last 6 months. It is located on her right side almost between her shoulder and her neck and runs down between her shoulder blades.  Prior to Admission medications   Medication Sig Start Date End Date Taking? Authorizing Provider  buPROPion (WELLBUTRIN XL) 300 MG 24 hr tablet Take 300 mg by mouth daily.   Yes Historical Provider, MD  celecoxib (CELEBREX) 100 MG capsule Take 1 capsule (100 mg total) by mouth 2 (two) times daily. Patient taking differently: Take 100 mg by mouth daily.  11/29/14  Yes Max T Hyatt, DPM  diazepam (VALIUM) 5 MG tablet  12/18/14  Yes Historical Provider, MD  Multiple Vitamins-Minerals (EYE VITAMINS) CAPS Take by mouth.   Yes Historical Provider, MD  traZODone (DESYREL) 150 MG tablet Take by mouth. 12/13/14  Yes Historical Provider, MD  ALPRAZolam Duanne Moron) 0.5 MG tablet Take by mouth. 06/06/14   Historical Provider, MD  Multiple Vitamins-Minerals (ICAPS) CAPS Take by mouth.    Historical Provider, MD    Patient Active Problem List   Diagnosis Date Noted  . Anxiety 03/23/2015  .  Body mass index 28.0-28.9, adult 03/23/2015  . Clinical depression 03/23/2015  . 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 [place] (subconjunctival hemorrhage) 03/23/2015  . Tenosynovitis 03/23/2015  . Variants of migraine 09/13/2009  . Adjustment disorder with mixed anxiety and depressed mood 06/11/2009    No past medical history on file.  History   Social History  . Marital Status: Married    Spouse Name: N/A  . Number of Children: N/A  . Years of Education: N/A   Occupational History  . Not on file.   Social History Main Topics  . Smoking status: Never Smoker   . Smokeless tobacco: Not on file  . Alcohol Use: Yes     Comment: rarely  . Drug Use: Not on file  . Sexual Activity: Not on file   Other Topics Concern  . Not on file   Social History Narrative    Allergies  Allergen Reactions  . Penicillin V Potassium Hives  . Penicillins Hives    Review of Systems  Constitutional: Negative.   Eyes: Negative.   Respiratory: Negative.   Cardiovascular: Negative.   Gastrointestinal: Negative.   Genitourinary: Negative.   Musculoskeletal: Positive for myalgias, joint pain and neck pain.  Skin: Negative.   Neurological: Negative.   Endo/Heme/Allergies: Negative.  Psychiatric/Behavioral: The patient is nervous/anxious.     Immunization History  Administered Date(s) Administered  . Tdap 02/28/2013  . Zoster 02/28/2013   Objective:  BP 128/70 mmHg  Pulse 78  Temp(Src) 97.7 F (36.5 C) (Oral)  Resp 16  Wt   Physical Exam  Constitutional: She is oriented to person, place, and time and well-developed, well-nourished, and in no distress.  HENT:  Head: Normocephalic and atraumatic.  Right Ear: External ear normal.  Left Ear: External ear normal.  Nose: Nose normal.  Eyes: Conjunctivae are normal.  Neck: Neck supple.  Cardiovascular: Normal rate, regular rhythm and normal heart sounds.   Pulmonary/Chest: Effort normal and  breath sounds normal.  Abdominal: Soft.  Neurological: She is alert and oriented to person, place, and time. Gait normal.  Skin: Skin is warm and dry.  Psychiatric: Mood, memory, affect and judgment normal.    Lab Results  Component Value Date   WBC 5.2 06/20/2014   HGB 13.2 06/20/2014   HCT 39 06/20/2014   PLT 249 06/20/2014   CHOL 225* 06/20/2014   TRIG 141 06/20/2014   HDL 66 06/20/2014   LDLCALC 131 06/20/2014   TSH 3.05 06/20/2014    CMP     Component Value Date/Time   NA 139 06/20/2014   K 4.8 06/20/2014   BUN 12 06/20/2014   CREATININE 0.7 06/20/2014   AST 18 06/20/2014   ALT 10 06/20/2014   ALKPHOS 90 06/20/2014    Assessment and Plan :  Anxiety and depression Pretty good control. Patient having job stress and a lot of marital stress. Also her 50 year old son had an MI last week. Continue present medication. Posterior shoulder pain versus mild scapular pain This is aggravated by movement but not exertion. Do not in anyway think this is cardiac or pulmonary. Also there is no rash to indicate possible shingles. Oral exam except for the discomfort with some movement. At this time we'll try Celebrex 200 mg daily for 2 weeks. Needed orthopedic referral if she does not improve.   Miguel Aschoff MD [facility] 04/24/2015 4:30 PM

## 2015-04-25 ENCOUNTER — Ambulatory Visit (INDEPENDENT_AMBULATORY_CARE_PROVIDER_SITE_OTHER): Payer: 59 | Admitting: Podiatry

## 2015-04-25 DIAGNOSIS — G5760 Lesion of plantar nerve, unspecified lower limb: Secondary | ICD-10-CM

## 2015-04-25 DIAGNOSIS — M722 Plantar fascial fibromatosis: Secondary | ICD-10-CM | POA: Diagnosis not present

## 2015-04-25 DIAGNOSIS — G5790 Unspecified mononeuropathy of unspecified lower limb: Secondary | ICD-10-CM

## 2015-04-25 NOTE — Progress Notes (Signed)
She presents today for follow-up of her plantar fasciitis and neuritis and plantar aspect of bilateral heel. She states that after the third alcohol injection she noticed some improvement.  Objective: Vital signs are stable she is alert and oriented 3. Pulses are palpable bilateral. Neurologic sensorium is intact per Semmes-Weinstein monofilament. Deep tendon reflexes are intact bilateral and muscle strength is 5 over 5 dorsal flexors plantar flexors and inverters everters all into the musculature is intact. She has mild tenderness on palpation medial calcaneal tubercles bilateral.  Assessment: Neuritis and plantar fasciitis bilateral.  Plan: Injected fourth dose of dehydrated alcohol to the bilateral foot today.

## 2015-05-23 ENCOUNTER — Ambulatory Visit (INDEPENDENT_AMBULATORY_CARE_PROVIDER_SITE_OTHER): Payer: 59 | Admitting: Podiatry

## 2015-05-23 ENCOUNTER — Encounter: Payer: Self-pay | Admitting: Podiatry

## 2015-05-23 VITALS — BP 115/76 | HR 82 | Resp 16

## 2015-05-23 DIAGNOSIS — G5790 Unspecified mononeuropathy of unspecified lower limb: Secondary | ICD-10-CM

## 2015-05-23 DIAGNOSIS — M722 Plantar fascial fibromatosis: Secondary | ICD-10-CM

## 2015-05-23 NOTE — Progress Notes (Signed)
She presents today for her fifth dose of dehydrated alcohol to the bilateral heels. She states that she can start to tell a difference now as she referred to the pain to bilateral heels.  Objective: Vital signs are stable alert and oriented 3. Pulses are strongly palpable. She still has pain on palpation medial calcaneal tubercles bilateral heels.  Plan: Reinjected the bilateral heels today fifth dehydrated alcohol injection to the point of maximal tenderness. We'll follow up with her in 1 month for her sixth injection.

## 2015-06-25 ENCOUNTER — Encounter: Payer: Self-pay | Admitting: Podiatry

## 2015-06-25 ENCOUNTER — Ambulatory Visit (INDEPENDENT_AMBULATORY_CARE_PROVIDER_SITE_OTHER): Payer: 59 | Admitting: Podiatry

## 2015-06-25 VITALS — BP 116/78 | HR 78 | Resp 12

## 2015-06-25 DIAGNOSIS — G5762 Lesion of plantar nerve, left lower limb: Secondary | ICD-10-CM

## 2015-06-25 DIAGNOSIS — M722 Plantar fascial fibromatosis: Secondary | ICD-10-CM | POA: Diagnosis not present

## 2015-06-25 DIAGNOSIS — G5790 Unspecified mononeuropathy of unspecified lower limb: Secondary | ICD-10-CM

## 2015-06-25 DIAGNOSIS — G5761 Lesion of plantar nerve, right lower limb: Secondary | ICD-10-CM

## 2015-06-25 NOTE — Progress Notes (Signed)
She presents today for follow-up of her neuritis and plantar fasciitis of the bilateral foot. Possibly one year ago she had an endoscopic fasciotomy refill to reveal her asymptomatic. At this point she is questioning whether or not an MRI may be best.  Objective: Vital signs are stable she is alert and oriented 3 she still has pain on palpation medial calcaneal tubercle. Palpation of the medial longitudinal arch demonstrates a tight palpable plantar fascia particularly along the medial band. No signs of infection no ecchymosis.  Assessment: Chronic intractable plantar fasciitis bilateral left greater than right. Neuritis bilateral foot.  Plan: Discussed etiology pathology conservative versus surgical therapies at this point I encouraged her to discontinue the use of the injectables and consider another MRI to the bilateral lower extremity for evaluation of the plantar fascia. At this point failure to yield her asymptomatic after a year and a half of surgery therapy injections sterile lites non-steroidal's oral meds decreased work duties and immobilization I do feel that MRI of the bilateral he is necessary.  Roselind Messier DPM

## 2015-06-27 ENCOUNTER — Telehealth: Payer: Self-pay | Admitting: *Deleted

## 2015-06-27 NOTE — Telephone Encounter (Addendum)
Prior authorization started-[place] ordered call [phone], spoke with Ms. Simeon Craft at 0930am - right MRI 56387, dx G57.90 neuritis approved for pt's plan and expedite due to [place] [phone].  Ms. Simeon Craft transferred to Nurse Reviewer - Danae Chen C left MRI 06301, dx G57.90 APPROVAL [phone].  Informed pt of the authorization and instructed to call for an appt.  Faxed.

## 2015-07-05 ENCOUNTER — Ambulatory Visit
Admission: RE | Admit: 2015-07-05 | Discharge: 2015-07-05 | Disposition: A | Discharge status: Home / Self Care | Payer: 59 | Source: Ambulatory Visit | Attending: Podiatry | Admitting: Podiatry

## 2015-07-05 DIAGNOSIS — G5790 Unspecified mononeuropathy of unspecified lower limb: Secondary | ICD-10-CM | POA: Insufficient documentation

## 2015-07-05 DIAGNOSIS — M722 Plantar fascial fibromatosis: Secondary | ICD-10-CM | POA: Diagnosis present

## 2015-07-12 ENCOUNTER — Telehealth: Payer: Self-pay | Admitting: *Deleted

## 2015-07-12 NOTE — Telephone Encounter (Addendum)
-----   Message from Garrel Ridgel, [place] sent at 07/05/2015  5:04 PM EDT ----- Have her in when I get back .  Informed pt of Dr. Stephenie Acres orders, pt states she has an appt scheduled.

## 2015-07-18 ENCOUNTER — Encounter: Payer: Self-pay | Admitting: Podiatry

## 2015-07-18 ENCOUNTER — Ambulatory Visit (INDEPENDENT_AMBULATORY_CARE_PROVIDER_SITE_OTHER): Payer: 59 | Admitting: Podiatry

## 2015-07-18 VITALS — BP 135/77 | HR 82 | Resp 18

## 2015-07-18 DIAGNOSIS — M722 Plantar fascial fibromatosis: Secondary | ICD-10-CM | POA: Diagnosis not present

## 2015-07-19 ENCOUNTER — Telehealth: Payer: Self-pay | Admitting: *Deleted

## 2015-07-19 NOTE — Telephone Encounter (Signed)
"  I'd like to set up my surgery.  I need to have it done by the end of the year."  He can do it on 07/26/2015.  "Does he have anything the following week or the week after?"  No, he doesn't have anything then.  He can do it 08/16/2015.  "Okay, that will be good.  Is it set up now or is there anything else I need to do?"  It is set up now.  You will need to register with the surgical center.  Instructions are in the surgical brochure.  "I'll have to go by the office to pick that up."

## 2015-07-19 NOTE — Progress Notes (Signed)
She presents today for follow-up of her MRI results. She states that her left foot may be doing some better but her right foot is still very painful.  Objective: Vital signs are stable she is alert and oriented 3. She still has pain on palpation medial calcaneal tubercle right greater than left. At this point her MRI does demonstrate active plantar fasciitis medial band right foot and mild plantar fasciitis associated with a previous endoscopic fasciotomy of the left foot.  Assessment: Chronic proximal plantar fasciitis right over left.  Plan: At this point we decided to perform an endoscopic plantar fasciotomy of the right foot. We went over the consent form today line by line number by number giving her ample time to ask questions she is off it regarding an endoscopic plantar fasciotomy of her right foot. I answered all the questions regarding this procedure to the best of my ability in layman's terms. She understood it was amenable to it and signed all 3 pages of the consent form. I will follow-up with her in the near future for surgical intervention. We discussed the possible postop complications which may include but are not limited to postop pain bleeding swelling infection continued pain and worsening of her pain development of blood clots. Loss of digit loss of limb loss of life.  Roselind Messier DPM

## 2015-07-30 ENCOUNTER — Ambulatory Visit: Payer: 59 | Admitting: Podiatry

## 2015-07-31 ENCOUNTER — Telehealth: Payer: Self-pay | Admitting: *Deleted

## 2015-07-31 NOTE — Telephone Encounter (Signed)
Authorization was obtained for surgery scheduled for 08/16/2015 for cpt 29893 by [facility].  Authorization number is [id number].  Authorization was faxed to Caren Griffins at [facility].

## 2015-08-14 ENCOUNTER — Telehealth: Payer: Self-pay | Admitting: *Deleted

## 2015-08-14 NOTE — Telephone Encounter (Signed)
"  Can you call and let me know which foot my surgery is going to be done on Thursday?  They're saying it's on the left foot.  I was thinking it's on the right.  Please call me."

## 2015-08-16 ENCOUNTER — Other Ambulatory Visit: Payer: Self-pay | Admitting: Podiatry

## 2015-08-16 DIAGNOSIS — M722 Plantar fascial fibromatosis: Secondary | ICD-10-CM

## 2015-08-16 MED ORDER — PROMETHAZINE HCL 25 MG PO TABS: 25.0000 mg | ORAL_TABLET | Freq: Three times a day (TID) | ORAL | Status: DC | PRN

## 2015-08-16 MED ORDER — CLINDAMYCIN HCL 150 MG PO CAPS: 150.0000 mg | ORAL_CAPSULE | Freq: Three times a day (TID) | ORAL | Status: DC

## 2015-08-16 MED ORDER — OXYCODONE-ACETAMINOPHEN 10-325 MG PO TABS: 1.0000 | ORAL_TABLET | ORAL | Status: DC | PRN

## 2015-08-22 ENCOUNTER — Encounter: Payer: Self-pay | Admitting: Podiatry

## 2015-08-22 ENCOUNTER — Ambulatory Visit (INDEPENDENT_AMBULATORY_CARE_PROVIDER_SITE_OTHER): Payer: 59 | Admitting: Podiatry

## 2015-08-22 VITALS — BP 124/73 | HR 80 | Resp 18

## 2015-08-22 DIAGNOSIS — Z9889 Other specified postprocedural states: Secondary | ICD-10-CM

## 2015-08-22 DIAGNOSIS — M722 Plantar fascial fibromatosis: Secondary | ICD-10-CM

## 2015-08-22 MED ORDER — OXYCODONE-ACETAMINOPHEN 10-325 MG PO TABS: 1.0000 | ORAL_TABLET | Freq: Four times a day (QID) | ORAL | Status: DC | PRN

## 2015-08-22 NOTE — Progress Notes (Signed)
She presents today for follow-up of her endoscopic plantar fasciotomy right heel date of surgery 08/16/2015. She states there is still very sore. She states that she is out of her pain medication like another refill.  Objective: Vital signs are stable she is alert and oriented 3. Dry sterile dressing intact was removed demonstrates no erythema edema saline as drainage or odor. Mild ecchymosis to the surgical site plantar foot. Sutures are intact medially and laterally. She has mild tenderness on palpation of the plantar heel.  Assessment: Well-healing endoscopic plantar fasciotomy right foot.  Plan: We'll place Band-Aids over the incisions today and encourage massage therapy to the plantar heel I also recommended showering and then soaking in Epsom salts and warm water. I will follow-up with her in 1 week for suture removal we did put her in a compression anklet.

## 2015-08-24 ENCOUNTER — Other Ambulatory Visit: Payer: Self-pay

## 2015-08-24 DIAGNOSIS — F419 Anxiety disorder, unspecified: Secondary | ICD-10-CM

## 2015-08-24 DIAGNOSIS — F329 Major depressive disorder, single episode, unspecified: Secondary | ICD-10-CM

## 2015-08-24 DIAGNOSIS — F32A Depression, unspecified: Secondary | ICD-10-CM

## 2015-08-24 MED ORDER — BUPROPION HCL ER (XL) 300 MG PO TB24: 300.0000 mg | ORAL_TABLET | Freq: Every day | ORAL | Status: DC

## 2015-08-27 ENCOUNTER — Ambulatory Visit: Payer: 59 | Admitting: Family Medicine

## 2015-08-29 ENCOUNTER — Encounter: Payer: Self-pay | Admitting: Podiatry

## 2015-08-29 ENCOUNTER — Ambulatory Visit (INDEPENDENT_AMBULATORY_CARE_PROVIDER_SITE_OTHER): Payer: 59 | Admitting: Podiatry

## 2015-08-29 VITALS — BP 129/84 | HR 99 | Resp 18

## 2015-08-29 DIAGNOSIS — Z9889 Other specified postprocedural states: Secondary | ICD-10-CM

## 2015-08-29 DIAGNOSIS — M722 Plantar fascial fibromatosis: Secondary | ICD-10-CM

## 2015-08-29 NOTE — Progress Notes (Signed)
She presents today for follow-up of her endoscopic plantar fasciotomy of the right foot. She states that on doing good.  Objective: Vital signs are stable she is alert and oriented 3 dry dressing was removed demonstrates margins were coapted sutures are intact. We removed the sutures today and placed her back into her tennis shoes during the daytime and orthotics as well as a night splint at nighttime.  Assessment: Healing surgical foot right.  Plan: Tennis shoe right foot plantar fascial brace at night. We may consider injecting the left heel next visit. I will follow up with her in 2 weeks.

## 2015-08-30 ENCOUNTER — Encounter: Payer: Self-pay | Admitting: Podiatry

## 2015-08-30 ENCOUNTER — Telehealth: Payer: Self-pay | Admitting: *Deleted

## 2015-08-30 NOTE — Telephone Encounter (Signed)
Pt states she has received a notice from her employer disability with a discrepancy in her return to work date.

## 2015-08-31 NOTE — Telephone Encounter (Signed)
Typed up new letter with new return to work date and faxed to [employer] yesterday evening. I called pt to let her know it was sent.

## 2015-09-06 ENCOUNTER — Encounter: Payer: Self-pay | Admitting: Family Medicine

## 2015-09-06 ENCOUNTER — Ambulatory Visit (INDEPENDENT_AMBULATORY_CARE_PROVIDER_SITE_OTHER): Payer: 59 | Admitting: Family Medicine

## 2015-09-06 VITALS — BP 108/66 | HR 82 | Temp 97.5°F | Resp 16

## 2015-09-06 DIAGNOSIS — F419 Anxiety disorder, unspecified: Secondary | ICD-10-CM | POA: Diagnosis not present

## 2015-09-06 DIAGNOSIS — Z23 Encounter for immunization: Secondary | ICD-10-CM | POA: Diagnosis not present

## 2015-09-06 DIAGNOSIS — G4700 Insomnia, unspecified: Secondary | ICD-10-CM

## 2015-09-06 DIAGNOSIS — F329 Major depressive disorder, single episode, unspecified: Secondary | ICD-10-CM

## 2015-09-06 DIAGNOSIS — M25511 Pain in right shoulder: Secondary | ICD-10-CM

## 2015-09-06 DIAGNOSIS — F32A Depression, unspecified: Secondary | ICD-10-CM

## 2015-09-06 MED ORDER — ALPRAZOLAM 0.5 MG PO TABS: 0.5000 mg | ORAL_TABLET | Freq: Two times a day (BID) | ORAL | Status: DC | PRN

## 2015-09-06 MED ORDER — TRAZODONE HCL 150 MG PO TABS: 150.0000 mg | ORAL_TABLET | Freq: Every day | ORAL | Status: DC

## 2015-09-06 MED ORDER — CELECOXIB 100 MG PO CAPS: 100.0000 mg | ORAL_CAPSULE | Freq: Every day | ORAL | Status: DC
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
## 2015-09-13 ENCOUNTER — Encounter: Payer: Self-pay | Admitting: Podiatry

## 2015-09-13 NOTE — Progress Notes (Signed)
DOS 08-16-15  EPF right

## 2015-09-19 ENCOUNTER — Encounter: Payer: Self-pay | Admitting: Podiatry

## 2015-09-19 ENCOUNTER — Ambulatory Visit (INDEPENDENT_AMBULATORY_CARE_PROVIDER_SITE_OTHER): Payer: 59 | Admitting: Podiatry

## 2015-09-19 VITALS — BP 119/74 | HR 100 | Resp 16

## 2015-09-19 DIAGNOSIS — Z9889 Other specified postprocedural states: Secondary | ICD-10-CM

## 2015-09-19 DIAGNOSIS — M722 Plantar fascial fibromatosis: Secondary | ICD-10-CM

## 2015-09-19 NOTE — Progress Notes (Signed)
She presents today for follow-up of her endoscopic plantar fasciotomy right foot date 08/16/2015. She states that her left foot is doing much better and her right foot seems to be progressively getting better. She denies fever chills nausea vomiting muscle aches and pains.  Objective: Vital signs are stable she is alert and oriented 3. Mild tenderness on palpation along the medial calcaneal tubercle.  Assessment: Well-healed surgical foot right.  Plan: Encouraged her transition from the [employer] to Tenn[employer] shoes over the next couple weeks and I will follow-up with her at that time.

## 2015-10-08 ENCOUNTER — Ambulatory Visit (INDEPENDENT_AMBULATORY_CARE_PROVIDER_SITE_OTHER): Payer: 59 | Admitting: Podiatry

## 2015-10-08 ENCOUNTER — Encounter: Payer: Self-pay | Admitting: Podiatry

## 2015-10-08 VITALS — BP 115/81 | HR 95 | Resp 16

## 2015-10-08 DIAGNOSIS — Z9889 Other specified postprocedural states: Secondary | ICD-10-CM

## 2015-10-08 DIAGNOSIS — M722 Plantar fascial fibromatosis: Secondary | ICD-10-CM

## 2015-10-08 NOTE — Progress Notes (Signed)
She presents today for follow-up of her endoscopic plantar fasciotomy of the  Right foot. She states that he has been doing pretty good with exception of the past week where she thinks she may have overdone it by dancing. She states that her left foot is greater than 50% improved. She states that she think she is not able to go back to work yet because of the inability to walk a lot without resting.  Date of surgery  08/16/2015.  Objective: Vital signs stable alert and oriented 3. Pulses are strongly palpable. She has mild tenderness on palpation just at the surgical site on the right foot. No open lesions or wounds.  Assessment: Well-healing surgical foot right with a minor setback.   Plan: continue at-home physical therapy and extend disability until March 6. I will follow-up with her 1 month from today.

## 2015-10-15 ENCOUNTER — Telehealth: Payer: Self-pay

## 2015-10-15 DIAGNOSIS — F329 Major depressive disorder, single episode, unspecified: Secondary | ICD-10-CM

## 2015-10-15 DIAGNOSIS — F32A Depression, unspecified: Secondary | ICD-10-CM

## 2015-10-15 DIAGNOSIS — F419 Anxiety disorder, unspecified: Secondary | ICD-10-CM

## 2015-10-15 MED ORDER — ALPRAZOLAM 0.5 MG PO TABS
0.5000 mg | ORAL_TABLET | Freq: Two times a day (BID) | ORAL | Status: DC | PRN
Start: 2015-10-15 — End: 2015-12-06

## 2015-10-15 NOTE — Telephone Encounter (Signed)
Fa from [facility] for refill on Xanax for the patient. Please review-aa

## 2015-10-15 NOTE — Telephone Encounter (Signed)
Printed for Dr G to sign. 

## 2015-10-15 NOTE — Telephone Encounter (Signed)
Ok to rf times 4

## 2015-10-24 ENCOUNTER — Other Ambulatory Visit: Payer: Self-pay | Admitting: Family Medicine

## 2015-10-24 DIAGNOSIS — Z1231 Encounter for screening mammogram for malignant neoplasm of breast: Secondary | ICD-10-CM

## 2015-10-26 ENCOUNTER — Ambulatory Visit
Admission: RE | Admit: 2015-10-26 | Discharge: 2015-10-26 | Disposition: A | Discharge status: Home / Self Care | Payer: 59 | Source: Ambulatory Visit | Attending: Family Medicine | Admitting: Family Medicine

## 2015-10-26 DIAGNOSIS — Z1231 Encounter for screening mammogram for malignant neoplasm of breast: Secondary | ICD-10-CM

## 2015-11-08 DIAGNOSIS — M25562 Pain in left knee: Secondary | ICD-10-CM | POA: Diagnosis not present

## 2015-11-08 DIAGNOSIS — E669 Obesity, unspecified: Secondary | ICD-10-CM | POA: Diagnosis not present

## 2015-11-08 DIAGNOSIS — Z1211 Encounter for screening for malignant neoplasm of colon: Secondary | ICD-10-CM | POA: Diagnosis not present

## 2015-11-08 DIAGNOSIS — Z01419 Encounter for gynecological examination (general) (routine) without abnormal findings: Secondary | ICD-10-CM | POA: Diagnosis not present

## 2015-11-08 DIAGNOSIS — M25561 Pain in right knee: Secondary | ICD-10-CM | POA: Diagnosis not present

## 2015-11-09 LAB — CBC AND DIFFERENTIAL
HEMATOCRIT: 37 % (ref 36–46)
Hemoglobin: 12.7 g/dL (ref 12.0–16.0)
NEUTROS ABS: 2 /uL
Platelets: 222 10*3/uL (ref 150–399)
WBC: 4.2 10^3/mL

## 2015-11-09 LAB — HEPATIC FUNCTION PANEL
ALT: 14 U/L (ref 7–35)
AST: 19 U/L (ref 13–35)
Alkaline Phosphatase: 99 U/L (ref 25–125)
Bilirubin, Total: 0.5 mg/dL

## 2015-11-09 LAB — TSH: TSH: 2.23 u[IU]/mL (ref 0.41–5.90)

## 2015-11-09 LAB — BASIC METABOLIC PANEL
BUN: 11 mg/dL (ref 4–21)
Creatinine: 0.8 mg/dL (ref 0.5–1.1)
GLUCOSE: 92 mg/dL
Potassium: 4.4 mmol/L (ref 3.4–5.3)
SODIUM: 143 mmol/L (ref 137–147)

## 2015-11-09 LAB — LIPID PANEL
Cholesterol: 221 mg/dL — AB (ref 0–200)
HDL: 57 mg/dL (ref 35–70)
LDL Cholesterol: 135 mg/dL
Triglycerides: 145 mg/dL (ref 40–160)

## 2015-11-09 LAB — HEMOGLOBIN A1C: Hemoglobin A1C: 5.1

## 2015-11-12 ENCOUNTER — Encounter: Payer: Self-pay | Admitting: Podiatry

## 2015-11-12 ENCOUNTER — Ambulatory Visit (INDEPENDENT_AMBULATORY_CARE_PROVIDER_SITE_OTHER): Payer: 59 | Admitting: Podiatry

## 2015-11-12 VITALS — BP 124/75 | HR 87 | Resp 16

## 2015-11-12 DIAGNOSIS — Z9889 Other specified postprocedural states: Secondary | ICD-10-CM

## 2015-11-12 DIAGNOSIS — M722 Plantar fascial fibromatosis: Secondary | ICD-10-CM

## 2015-11-12 MED ORDER — METHYLPREDNISOLONE 4 MG PO TBPK: ORAL_TABLET | ORAL | Status: DC

## 2015-11-12 NOTE — Progress Notes (Signed)
She presents today for follow-up of her endoscopic plantar fasciotomy of her right foot. She states is seems to be doing some better however, only for very long time and becomes quite painful. She states that at work she is on her feet from the time she gets there until she leaves.  Objective: Vital signs stable alert and oriented 3. Much decrease in pain on palpation make it manageable was bilateral. Still present. Pulses remain palpable bilateral.  Assessment: Residual endoscopic plantar fasciotomy pain bilateral.  Plan: Start her on a Medrol Dosepak for another 6 days. We will allow her to go back to work light duty or sitting. Follow-up with her in a couple of months

## 2015-12-06 ENCOUNTER — Telehealth: Payer: Self-pay

## 2015-12-06 ENCOUNTER — Encounter: Payer: Self-pay | Admitting: Family Medicine

## 2015-12-06 ENCOUNTER — Ambulatory Visit (INDEPENDENT_AMBULATORY_CARE_PROVIDER_SITE_OTHER): Payer: 59 | Admitting: Family Medicine

## 2015-12-06 VITALS — BP 128/80 | HR 72 | Temp 97.5°F | Resp 12

## 2015-12-06 DIAGNOSIS — F329 Major depressive disorder, single episode, unspecified: Secondary | ICD-10-CM | POA: Diagnosis not present

## 2015-12-06 DIAGNOSIS — G4700 Insomnia, unspecified: Secondary | ICD-10-CM | POA: Diagnosis not present

## 2015-12-06 DIAGNOSIS — F419 Anxiety disorder, unspecified: Secondary | ICD-10-CM | POA: Diagnosis not present

## 2015-12-06 DIAGNOSIS — F32A Depression, unspecified: Secondary | ICD-10-CM

## 2015-12-06 MED ORDER — ALPRAZOLAM 0.5 MG PO TABS: 0.5000 mg | ORAL_TABLET | Freq: Two times a day (BID) | ORAL | Status: DC | PRN

## 2015-12-06 MED ORDER — PROMETHAZINE HCL 25 MG PO TABS: 25.0000 mg | ORAL_TABLET | Freq: Three times a day (TID) | ORAL | Status: DC | PRN

## 2015-12-06 MED ORDER — DULOXETINE HCL 20 MG PO CPEP: 20.0000 mg | ORAL_CAPSULE | Freq: Every day | ORAL | Status: DC

## 2015-12-06 MED ORDER — TRAZODONE HCL 150 MG PO TABS: 150.0000 mg | ORAL_TABLET | Freq: Every day | ORAL | Status: DC

## 2015-12-06 MED ORDER — BUPROPION HCL ER (XL) 300 MG PO TB24: 300.0000 mg | ORAL_TABLET | Freq: Every day | ORAL | Status: DC

## 2015-12-06 MED ORDER — CELECOXIB 100 MG PO CAPS: 100.0000 mg | ORAL_CAPSULE | Freq: Every day | ORAL | Status: DC

## 2015-12-06 NOTE — Telephone Encounter (Signed)
Below message is an error-wrong patient. Thanks-aa

## 2015-12-06 NOTE — Progress Notes (Signed)
Patient ID: Yvette Rogers, female   DOB: 02-15-1946, 70 y.o.   MRN: [id number]    Subjective:  HPI  Patient is here for 3 months follow up.  Depression: Patient was not doing good on her last visit emotionally. Her PHQ9 score then was 24. She has been seen ERP couseling through work and feels like maybe she is a little bit better since her last visit. Depression screen [facility] 2/9 12/06/2015 09/06/2015  Decreased Interest 3 3  Down, Depressed, Hopeless 2 3  PHQ - 2 Score 5 6  Altered sleeping 3 3  Tired, decreased energy 3 3  Change in appetite 2 3  Feeling bad or failure about yourself  2 3  Trouble concentrating 2 3  Moving slowly or fidgety/restless 3 2  Suicidal thoughts 0 1  PHQ-9 Score 20 24  Difficult doing work/chores Somewhat difficult Somewhat difficult       She is changing jobs and insurance will be different and she would like to get refills on her medications for 90 day supply.  Prior to Admission medications   Medication Sig Start Date End Date Taking? Authorizing Provider  ALPRAZolam Duanne Moron) 0.5 MG tablet Take 1 tablet (0.5 mg total) by mouth 2 (two) times daily as needed for anxiety. 10/15/15  Yes Richard Maceo Pro., MD  buPROPion (WELLBUTRIN XL) 300 MG 24 hr tablet Take 1 tablet (300 mg total) by mouth daily. 08/24/15  Yes Richard Maceo Pro., MD  celecoxib (CELEBREX) 100 MG capsule Take 1 capsule (100 mg total) by mouth daily. 09/06/15  Yes Richard Maceo Pro., MD  promethazine (PHENERGAN) 25 MG tablet Take 1 tablet (25 mg total) by mouth every 8 (eight) hours as needed for nausea or vomiting. 08/16/15  Yes Max T Hyatt, DPM  traZODone (DESYREL) 150 MG tablet Take 1 tablet (150 mg total) by mouth at bedtime. 09/06/15  Yes Richard Maceo Pro., MD  Vitamin D, Ergocalciferol, (DRISDOL) 50000 units CAPS capsule Take 50,000 Units by mouth every 7 (seven) days.   Yes Historical Provider, MD    Patient Active Problem List   Diagnosis Date Noted  . Anxiety  03/23/2015  . Body mass index 28.0-28.9, adult 03/23/2015  . Clinical depression 03/23/2015  . 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 [place] (subconjunctival hemorrhage) 03/23/2015  . Tenosynovitis 03/23/2015  . Variants of migraine 09/13/2009  . Adjustment disorder with mixed anxiety and depressed mood 06/11/2009    No past medical history on file.  Social History   Social History  . Marital Status: Married    Spouse Name: N/A  . Number of Children: N/A  . Years of Education: N/A   Occupational History  . Not on file.   Social History Main Topics  . Smoking status: Never Smoker   . Smokeless tobacco: Never Used  . Alcohol Use: Yes     Comment: rarely  . Drug Use: No  . Sexual Activity: Not on file   Other Topics Concern  . Not on file   Social History Narrative    Allergies  Allergen Reactions  . Penicillin V Potassium Hives  . Penicillins Hives    Review of Systems  Constitutional: Negative.   Respiratory: Negative.   Cardiovascular: Negative.   Musculoskeletal: Negative.   Psychiatric/Behavioral: Positive for depression.    Immunization History  Administered Date(s) Administered  . Pneumococcal Conjugate-13 09/06/2015  . Tdap 02/28/2013  . Zoster 02/28/2013   Objective:  BP 128/80  mmHg  Pulse 72  Temp(Src) 97.5 F (36.4 C)  Resp 12  Wt   LMP  (LMP Unknown)  Physical Exam  Constitutional: She is oriented to person, place, and time and well-developed, well-nourished, and in no distress.  HENT:  Head: Normocephalic and atraumatic.  Right Ear: External ear normal.  Left Ear: External ear normal.  Eyes: Conjunctivae are normal.  Neck: Neck supple.  Cardiovascular: Normal rate, regular rhythm and normal heart sounds.   Pulmonary/Chest: Effort normal and breath sounds normal.  Abdominal: Soft.  Neurological: She is alert and oriented to person, place, and time. Gait normal. GCS score is 15.  Skin: Skin is  warm and dry.  Psychiatric: Mood, memory, affect and judgment normal.    Lab Results  Component Value Date   WBC 5.2 06/20/2014   HGB 13.2 06/20/2014   HCT 39 06/20/2014   PLT 249 06/20/2014   CHOL 225* 06/20/2014   TRIG 141 06/20/2014   HDL 66 06/20/2014   LDLCALC 131 06/20/2014   TSH 3.05 06/20/2014    CMP     Component Value Date/Time   NA 139 06/20/2014   K 4.8 06/20/2014   BUN 12 06/20/2014   CREATININE 0.7 06/20/2014   AST 18 06/20/2014   ALT 10 06/20/2014   ALKPHOS 90 06/20/2014    Assessment and Plan :  1. Clinical depression A little better. PHQ 9 score is 20 today was 24 3 months ago. Will add Cymbalta and continue other medications. If she tolerates this well will increase Cymbalta then at 30 mg. - DULoxetine (CYMBALTA) 20 MG capsule; Take 1 capsule (20 mg total) by mouth daily.  Dispense: 30 capsule; Refill: 1 - buPROPion (WELLBUTRIN XL) 300 MG 24 hr tablet; Take 1 tablet (300 mg total) by mouth daily.  Dispense: 90 tablet; Refill: 3 - ALPRAZolam (XANAX) 0.5 MG tablet; Take 1 tablet (0.5 mg total) by mouth 2 (two) times daily as needed for anxiety.  Dispense: 180 tablet; Refill: 2  2. Anxiety Refill given.more than 50% of this visit spent in counseling. - DULoxetine (CYMBALTA) 20 MG capsule; Take 1 capsule (20 mg total) by mouth daily.  Dispense: 30 capsule; Refill: 1 - buPROPion (WELLBUTRIN XL) 300 MG 24 hr tablet; Take 1 tablet (300 mg total) by mouth daily.  Dispense: 90 tablet; Refill: 3 - ALPRAZolam (XANAX) 0.5 MG tablet; Take 1 tablet (0.5 mg total) by mouth 2 (two) times daily as needed for anxiety.  Dispense: 180 tablet; Refill: 2 Patient advised to try to cut back on use of alprazolam. 3. Insomnia Stable on the medication. - traZODone (DESYREL) 150 MG tablet; Take 1 tablet (150 mg total) by mouth at bedtime.  Dispense: 90 tablet; Refill: 3 4. Chronic insomnia Patient was seen and examined by Dr. Eulas Post and note was scribed by Theressa Millard, RMA.   Miguel Aschoff MD [facility] 12/06/2015 3:47 PM

## 2015-12-06 NOTE — Telephone Encounter (Signed)
Patient called and states that he received letter from his insurance company stating that for next refill on Zolpidem and Methocarbamol we need to contact insurance company. Zolpidem will need coverage determination- states the representative he spoke with said patient does not have any sleep aid medications on the list that will be automatically covered. For Methocarbamol he was advised 800 mg was on a lower tier then the 750 mg -it is going to be $85 more then it was. CB number to insurance is [phone]. i am working on this-aa 

## 2015-12-31 ENCOUNTER — Ambulatory Visit: Payer: 59 | Admitting: Podiatry

## 2016-01-01 IMAGING — MG MM DIGITAL SCREENING BILAT W/ CAD
4 series · 4 of 4 positions shown · non-contrast
Comparison: Previous exam(s).

CLINICAL DATA: Screening.

EXAM:
DIGITAL SCREENING BILATERAL MAMMOGRAM WITH CAD

[R CC]
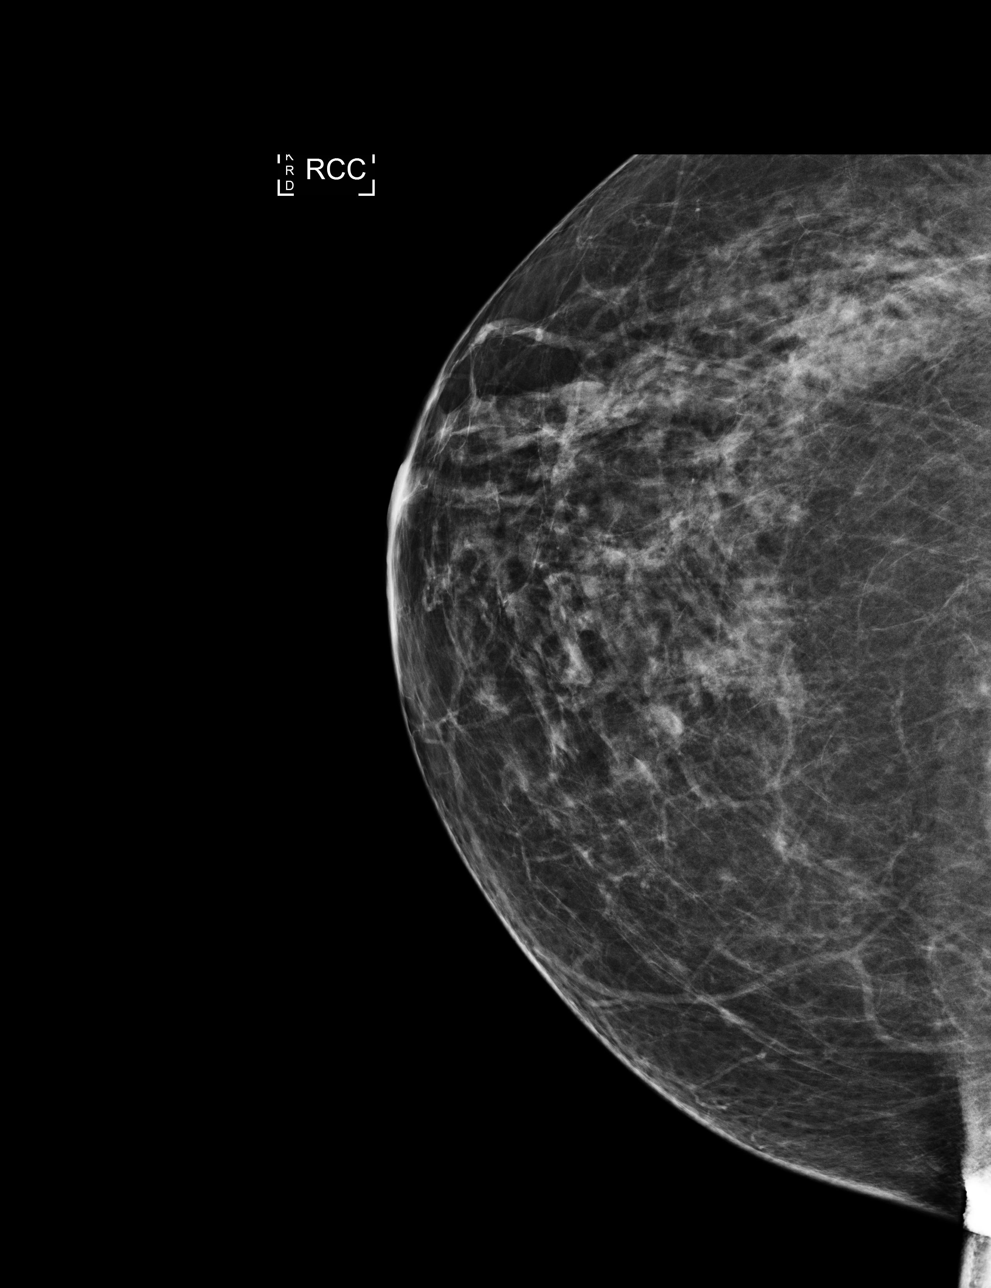

[L MLO]
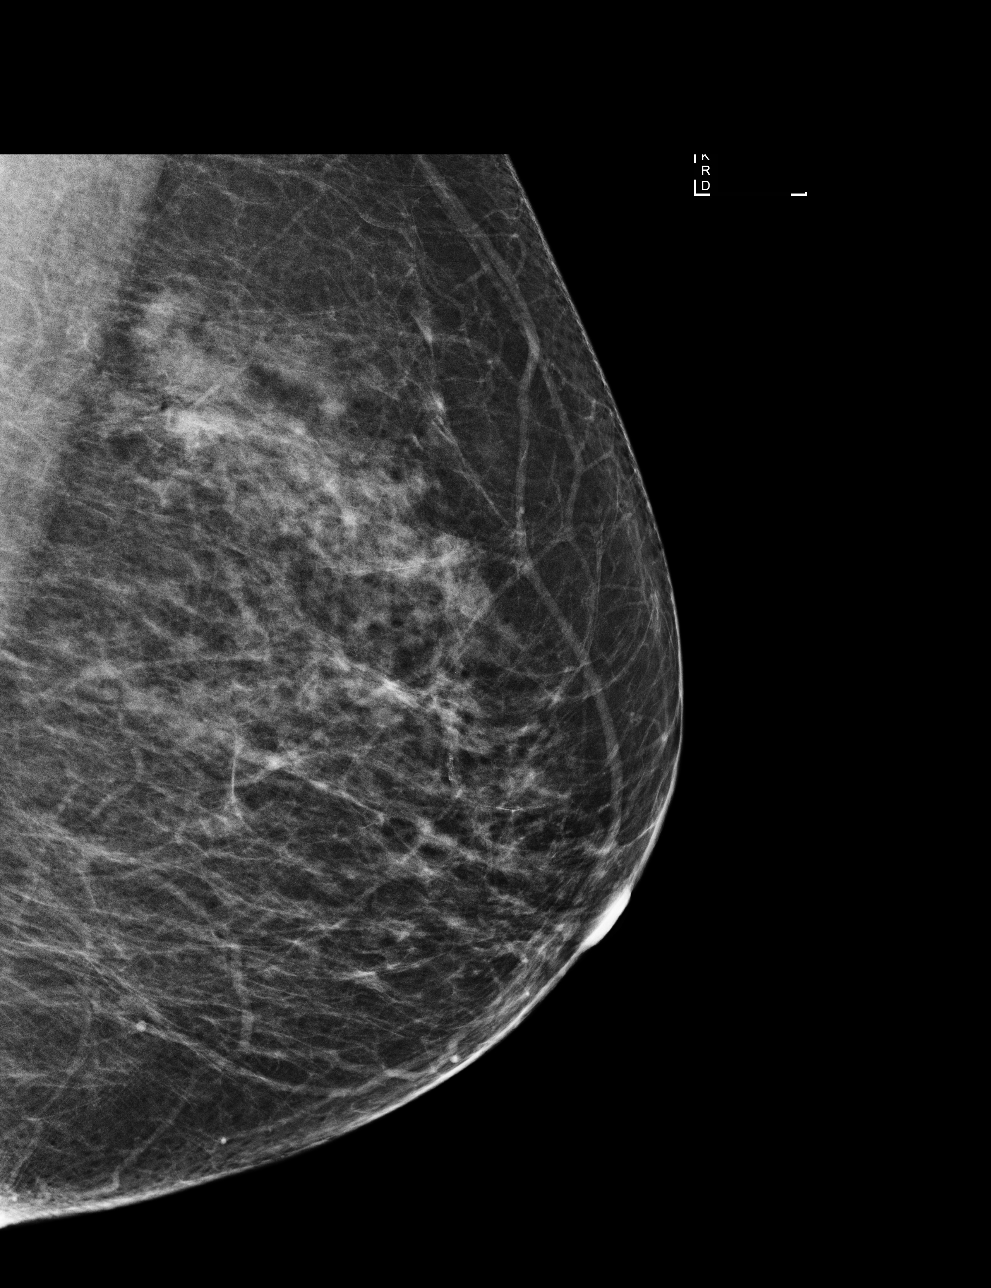

[R MLO]
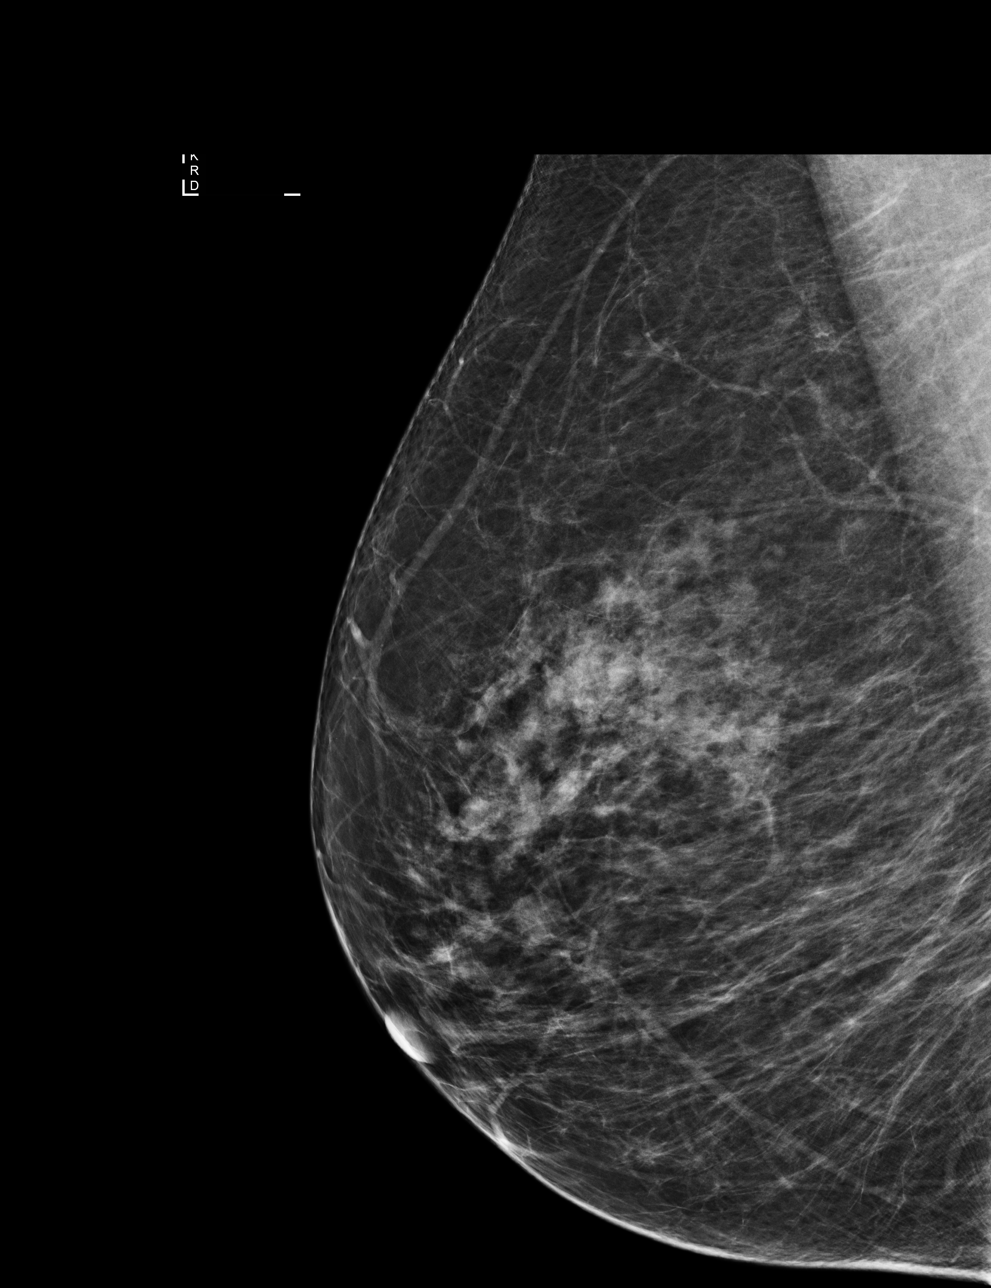

[L CC]
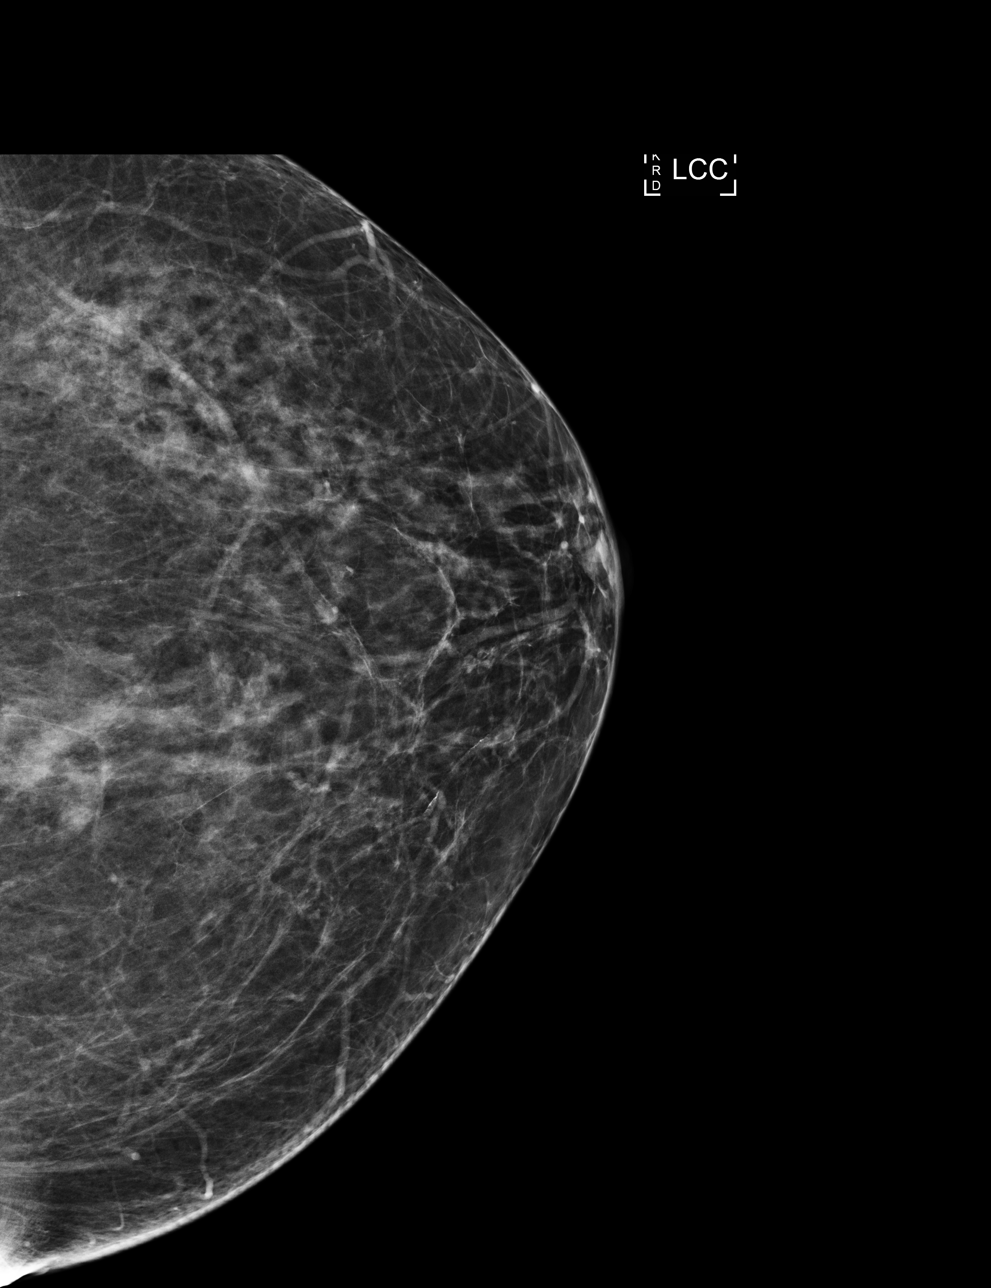

[4 of 4 positions shown; findings below may reference images not displayed]

ACR Breast Density Category c: The breast tissue is heterogeneously
dense, which may obscure small masses.
FINDINGS: There are no findings suspicious for malignancy. Images were
processed with CAD.
IMPRESSION: No mammographic evidence of malignancy. A result letter of this
screening mammogram will be mailed directly to the patient.

RECOMMENDATION:
Screening mammogram in one year. (Code:[id number])

BI-RADS CATEGORY  1: Negative.

## 2016-01-10 ENCOUNTER — Ambulatory Visit: Payer: 59 | Admitting: Family Medicine

## 2016-01-11 ENCOUNTER — Other Ambulatory Visit: Payer: Self-pay | Admitting: Family Medicine

## 2016-01-24 ENCOUNTER — Ambulatory Visit: Payer: 59 | Admitting: Family Medicine

## 2016-02-05 ENCOUNTER — Ambulatory Visit: Payer: 59 | Admitting: Family Medicine

## 2016-02-21 ENCOUNTER — Ambulatory Visit: Payer: 59 | Admitting: Family Medicine

## 2016-03-12 ENCOUNTER — Other Ambulatory Visit: Payer: Self-pay | Admitting: Family Medicine

## 2016-04-06 ENCOUNTER — Ambulatory Visit
Admission: EM | Admit: 2016-04-06 | Discharge: 2016-04-06 | Disposition: A | Discharge status: Home / Self Care | Payer: PPO | Attending: Family Medicine | Admitting: Family Medicine

## 2016-04-06 ENCOUNTER — Encounter: Payer: Self-pay | Admitting: Gynecology

## 2016-04-06 DIAGNOSIS — J0100 Acute maxillary sinusitis, unspecified: Secondary | ICD-10-CM

## 2016-04-06 HISTORY — DX: Anxiety disorder, unspecified: F41.9

## 2016-04-06 LAB — RAPID STREP SCREEN (MED CTR MEBANE ONLY): STREPTOCOCCUS, GROUP A SCREEN (DIRECT): NEGATIVE

## 2016-04-06 MED ORDER — AZITHROMYCIN 250 MG PO TABS: ORAL_TABLET | ORAL | 0 refills | Status: DC

## 2016-04-06 NOTE — ED Triage Notes (Signed)
Patient c/o sore throat x 1 week.

## 2016-04-06 NOTE — ED Provider Notes (Signed)
[facility]    CSN: [id number] Arrival date & time: 04/06/16  1206  First Provider Contact:  None       History   Chief Complaint Chief Complaint  Patient presents with  . Sore Throat    HPI Yvette Rogers is a 70 y.o. female.   The history is provided by the patient.  Sore Throat  This is a new problem. Pertinent negatives include no headaches.  URI  Presenting symptoms: congestion, facial pain and sore throat   Severity:  Moderate Onset quality:  Sudden Duration:  1 week Timing:  Constant Progression:  Worsening Chronicity:  New Relieved by:  Nothing Ineffective treatments:  OTC medications Associated symptoms: sinus pain   Associated symptoms: no arthralgias, no headaches, no myalgias, no neck pain, no swollen glands and no wheezing   Risk factors: no chronic cardiac disease, no chronic kidney disease, no chronic respiratory disease, no diabetes mellitus, no immunosuppression, no recent illness, no recent travel and no sick contacts     Past Medical History:  Diagnosis Date  . Anxiety     Patient Active Problem List   Diagnosis Date Noted  . Anxiety 03/23/2015  . Body mass index 28.0-28.9, adult 03/23/2015  . Clinical depression 03/23/2015  . 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 [place] (subconjunctival hemorrhage) 03/23/2015  . Tenosynovitis 03/23/2015  . Variants of migraine 09/13/2009  . Adjustment disorder with mixed anxiety and depressed mood 06/11/2009    Past Surgical History:  Procedure Laterality Date  . BUNIONECTOMY Right   . CHOLECYSTECTOMY    . EYE SURGERY Bilateral    Cataract removed  . GALLBLADDER SURGERY    . SKIN CANCER EXCISION     face  . SKIN CANCER EXCISION Left 08/22/2013   left nostril area    OB History    No data available       Home Medications    Prior to Admission medications   Medication Sig Start Date End Date Taking? Authorizing Provider  meloxicam (MOBIC)  15 MG tablet Take 15 mg by mouth daily.   Yes Historical Provider, MD  ALPRAZolam Duanne Moron) 0.5 MG tablet Take 1 tablet (0.5 mg total) by mouth 2 (two) times daily as needed for anxiety. 12/06/15   Richard Maceo Pro., MD  azithromycin (ZITHROMAX) 250 MG tablet Take first 2 tablets together, then 1 every day until finished. 04/06/16   Norval Gable, MD  buPROPion (WELLBUTRIN XL) 300 MG 24 hr tablet Take 1 tablet (300 mg total) by mouth daily. 12/06/15   Richard Maceo Pro., MD  celecoxib (CELEBREX) 100 MG capsule Take 1 capsule (100 mg total) by mouth daily. 12/06/15   Richard Maceo Pro., MD  celecoxib (CELEBREX) 100 MG capsule Take 1 capsule by mouth  daily 01/11/16   Jerrol Banana., MD  DULoxetine (CYMBALTA) 20 MG capsule take 1 capsule by mouth once daily 03/13/16   Jerrol Banana., MD  promethazine (PHENERGAN) 25 MG tablet Take 1 tablet (25 mg total) by mouth every 8 (eight) hours as needed for nausea or vomiting. 12/06/15   Jerrol Banana., MD  traZODone (DESYREL) 150 MG tablet Take 1 tablet (150 mg total) by mouth at bedtime. 12/06/15   Richard Maceo Pro., MD  Vitamin D, Ergocalciferol, (DRISDOL) 50000 units CAPS capsule Take 50,000 Units by mouth every 7 (seven) days.    Historical Provider, MD    Family History Family History  Problem Relation Age of Onset  . Cancer Mother     pancreatic  . Diabetes Mother   . Kidney Stones Mother   . COPD Father   . Diabetes Father   . Macular degeneration Father   . Breast cancer Sister 44  . Cancer Brother     Leukemia    Social History Social History  Substance Use Topics  . Smoking status: Never Smoker  . Smokeless tobacco: Never Used  . Alcohol use Yes     Comment: rarely     Allergies   Penicillin v potassium and Penicillins   Review of Systems Review of Systems  HENT: Positive for congestion and sore throat.   Respiratory: Negative for wheezing.   Musculoskeletal: Negative for arthralgias, myalgias and  neck pain.  Neurological: Negative for headaches.     Physical Exam Triage Vital Signs ED Triage Vitals  Enc Vitals Group     BP 04/06/16 1222 134/67     Pulse Rate 04/06/16 1222 73     Resp 04/06/16 1222 16     Temp 04/06/16 1222 98 F (36.7 C)     Temp Source 04/06/16 1222 Oral     SpO2 04/06/16 1222 99 %     Weight 04/06/16 1222 165 lb (74.8 kg)     Height 04/06/16 1222 5\' 5"  (1.651 m)     Head Circumference --      Peak Flow --      Pain Score 04/06/16 1228 5     Pain Loc --      Pain Edu? --      Excl. in [place]? --    No data found.   Updated Vital Signs BP 134/67 (BP Location: Left Arm)   Pulse 73   Temp 98 F (36.7 C) (Oral)   Resp 16   Ht 5\' 5"  (1.651 m)   Wt 165 lb (74.8 kg)   LMP  (LMP Unknown)   SpO2 99%   BMI 27.46 kg/m   Visual Acuity Right Eye Distance:   Left Eye Distance:   Bilateral Distance:    Right Eye Near:   Left Eye Near:    Bilateral Near:     Physical Exam  Constitutional: She appears well-developed and well-nourished. No distress.  HENT:  Head: Normocephalic and atraumatic.  Right Ear: Tympanic membrane, external ear and ear canal normal.  Left Ear: Tympanic membrane, external ear and ear canal normal.  Nose: Mucosal edema and rhinorrhea present. No nose lacerations, sinus tenderness, nasal deformity, septal deviation or nasal septal hematoma. No epistaxis.  No foreign bodies. Right sinus exhibits maxillary sinus tenderness and frontal sinus tenderness. Left sinus exhibits maxillary sinus tenderness and frontal sinus tenderness.  Mouth/Throat: Uvula is midline and mucous membranes are normal. Posterior oropharyngeal erythema present. No oropharyngeal exudate or tonsillar abscesses.  Eyes: Conjunctivae and EOM are normal. Pupils are equal, round, and reactive to light. Right eye exhibits no discharge. Left eye exhibits no discharge. No scleral icterus.  Neck: Normal range of motion. Neck supple. No thyromegaly present.  Cardiovascular:  Normal rate, regular rhythm and normal heart sounds.   Pulmonary/Chest: Effort normal and breath sounds normal. No respiratory distress. She has no wheezes. She has no rales.  Lymphadenopathy:    She has no cervical adenopathy.  Skin: She is not diaphoretic.  Nursing note and vitals reviewed.    UC Treatments / Results  Labs (all labs ordered are listed, but only abnormal results are displayed) Labs Reviewed  RAPID STREP SCREEN (NOT AT [facility])  CULTURE, GROUP A STREP [facility])    EKG  EKG Interpretation None       Radiology No results found.  Procedures Procedures (including critical care time)  Medications Ordered in UC Medications - No data to display   Initial Impression / Assessment and Plan / UC Course  I have reviewed the triage vital signs and the nursing notes.  Pertinent labs & imaging results that were available during my care of the patient were reviewed by me and considered in my medical decision making (see chart for details).  Clinical Course      Final Clinical Impressions(s) / UC Diagnoses   Final diagnoses:  Acute maxillary sinusitis, recurrence not specified    New Prescriptions Discharge Medication List as of 04/06/2016  1:23 PM    START taking these medications   Details  azithromycin (ZITHROMAX) 250 MG tablet Take first 2 tablets together, then 1 every day until finished., Normal      1. Lab results and diagnosis reviewed with patient 2. rx as per orders above; reviewed possible side effects, interactions, risks and benefits  3. Recommend supportive treatment with otc flonase, otc decongestants/antihistamine 4. Follow-up prn if symptoms worsen or don't improve   Norval Gable, MD 04/06/16 1350

## 2016-04-09 LAB — CULTURE, GROUP A STREP (THRC)

## 2016-05-18 ENCOUNTER — Ambulatory Visit
Admission: EM | Admit: 2016-05-18 | Discharge: 2016-05-18 | Disposition: A | Discharge status: Home / Self Care | Payer: PPO | Attending: Family Medicine | Admitting: Family Medicine

## 2016-05-18 ENCOUNTER — Encounter: Payer: Self-pay | Admitting: Emergency Medicine

## 2016-05-18 DIAGNOSIS — J0100 Acute maxillary sinusitis, unspecified: Secondary | ICD-10-CM | POA: Diagnosis not present

## 2016-05-18 MED ORDER — DOXYCYCLINE HYCLATE 100 MG PO CAPS: 100.0000 mg | ORAL_CAPSULE | Freq: Two times a day (BID) | ORAL | 0 refills | Status: DC

## 2016-05-18 MED ORDER — BENZONATATE 100 MG PO CAPS: 100.0000 mg | ORAL_CAPSULE | Freq: Three times a day (TID) | ORAL | 0 refills | Status: DC | PRN

## 2016-05-18 NOTE — ED Provider Notes (Signed)
[facility] ____________________________________________  Time seen: Approximately 3:16 PM  I have reviewed the triage vital signs and the nursing notes.   HISTORY  Chief Complaint Facial Pain and Sore Throat   HPI Yvette Rogers is a 70 y.o. female presents with a complaint of one week of runny nose, nasal congestion, sinus pressure and postnasal drip. Patient also reports ears feel congested. Reports history of similar in the past. Reports chronic history of seasonal allergies. Reports continues to eat and drink well. Denies fevers. Reports occasional cough. Reports cough is worse at night with postnasal drip. Denies known sick contacts.  Denies chest pain, shortness breath, abdominal pain, dysuria, neck pain, back pain, extremity pain.  [name], MD PCP   Past Medical History:  Diagnosis Date  . Anxiety     Patient Active Problem List   Diagnosis Date Noted  . Anxiety 03/23/2015  . Body mass index 28.0-28.9, adult 03/23/2015  . Clinical depression 03/23/2015  . 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 [place] (subconjunctival hemorrhage) 03/23/2015  . Tenosynovitis 03/23/2015  . Variants of migraine 09/13/2009  . Adjustment disorder with mixed anxiety and depressed mood 06/11/2009    Past Surgical History:  Procedure Laterality Date  . BUNIONECTOMY Right   . CHOLECYSTECTOMY    . EYE SURGERY Bilateral    Cataract removed  . GALLBLADDER SURGERY    . SKIN CANCER EXCISION     face  . SKIN CANCER EXCISION Left 08/22/2013   left nostril area   No current facility-administered medications for this encounter.   Current Outpatient Prescriptions:  .  ALPRAZolam (XANAX) 0.5 MG tablet, Take 1 tablet (0.5 mg total) by mouth 2 (two) times daily as needed for anxiety., Disp: 180 tablet, Rfl: 2 .  benzonatate (TESSALON PERLES) 100 MG capsule, Take 1 capsule (100 mg total) by mouth 3 (three) times daily as needed for  cough., Disp: 15 capsule, Rfl: 0 .  buPROPion (WELLBUTRIN XL) 300 MG 24 hr tablet, Take 1 tablet (300 mg total) by mouth daily., Disp: 90 tablet, Rfl: 3 .  doxycycline (VIBRAMYCIN) 100 MG capsule, Take 1 capsule (100 mg total) by mouth 2 (two) times daily., Disp: 20 capsule, Rfl: 0 .  meloxicam (MOBIC) 15 MG tablet, Take 15 mg by mouth daily., Disp: , Rfl:  .  promethazine (PHENERGAN) 25 MG tablet, Take 1 tablet (25 mg total) by mouth every 8 (eight) hours as needed for nausea or vomiting., Disp: 45 tablet, Rfl: 2 .  traZODone (DESYREL) 150 MG tablet, Take 1 tablet (150 mg total) by mouth at bedtime., Disp: 90 tablet, Rfl: 3  Allergies Penicillin v potassium and Penicillins  Family History  Problem Relation Age of Onset  . Cancer Mother     pancreatic  . Diabetes Mother   . Kidney Stones Mother   . COPD Father   . Diabetes Father   . Macular degeneration Father   . Breast cancer Sister 52  . Cancer Brother     Leukemia    Social History Social History  Substance Use Topics  . Smoking status: Never Smoker  . Smokeless tobacco: Never Used  . Alcohol use Yes     Comment: rarely    Review of Systems Constitutional: No fever/chills Eyes: No visual changes. ENT: No sore throat. Cardiovascular: Denies chest pain. Respiratory: Denies shortness of breath. Gastrointestinal: No abdominal pain.  No nausea, no vomiting.  No diarrhea.  No constipation. Genitourinary: Negative for dysuria. Musculoskeletal:  Negative for back pain. Skin: Negative for rash. Neurological: Negative for headaches, focal weakness or numbness.  10-point ROS otherwise negative.  ____________________________________________   PHYSICAL EXAM:  VITAL SIGNS: ED Triage Vitals  Enc Vitals Group     BP 05/18/16 1442 (!) 146/69     Pulse Rate 05/18/16 1442 74     Resp 05/18/16 1442 16     Temp 05/18/16 1442 97.9 F (36.6 C)     Temp Source 05/18/16 1442 Oral     SpO2 05/18/16 1442 98 %     Weight  05/18/16 1442 165 lb (74.8 kg)     Height 05/18/16 1442 5\' 5"  (1.651 m)     Head Circumference --      Peak Flow --      Pain Score 05/18/16 1445 4     Pain Loc --      Pain Edu? --      Excl. in [place]? --     Constitutional: Alert and oriented. Well appearing and in no acute distress. Eyes: Conjunctivae are normal. PERRL. EOMI. Head: Atraumatic.Mild to moderate tenderness to palpation bilateral  maxillary sinuses, mild tenderness to palpation bilateral frontal sinuses. No swelling. No erythema.   Ears: no erythema, normal TMs bilaterally.   Nose: nasal congestion with bilateral nasal turbinate erythema and edema.   Mouth/Throat: Mucous membranes are moist.  Oropharynx non-erythematous.No tonsillar swelling or exudate.  Neck: No stridor.  No cervical spine tenderness to palpation. Hematological/Lymphatic/Immunilogical: No cervical lymphadenopathy. Cardiovascular: Normal rate, regular rhythm. Grossly normal heart sounds.  Good peripheral circulation. Respiratory: Normal respiratory effort.  No retractions. Lungs CTAB. No wheezes, rales or rhonchi. Good air movement.  Gastrointestinal: Soft and nontender. No CVA tenderness. Musculoskeletal: No lower or upper extremity tenderness nor edema. No cervical, thoracic or lumbar tenderness to palpation.  Neurologic:  Normal speech and language. No gross focal neurologic deficits are appreciated. No gait instability. Skin:  Skin is warm, dry and intact. No rash noted. Psychiatric: Mood and affect are normal. Speech and behavior are normal.  ___________________________________________   LABS (all labs ordered are listed, but only abnormal results are displayed)  Labs Reviewed - No data to display   PROCEDURES Procedures   INITIAL IMPRESSION / ASSESSMENT AND PLAN / ED COURSE  Pertinent labs & imaging results that were available during my care of the patient were reviewed by me and considered in my medical decision making (see chart for  details).  Well-appearing patient. No acute distress. Presents with complaints of 1 week of runny nose, is interested sinus pressure and sinus drainage. Suspect maxillary sinusitis. Patient allergic to penicillin. Will treat patient with oral doxycycline. Counseled regarding for photosensitivity with antibiotic. Encouraged rest, fluids, over-the-counter supportive treatments including antihistamines. PRNTessalon Perles.Discussed indication, risks and benefits of medications with patient.  Discussed follow up with Primary care physician this week. Discussed follow up and return parameters including no resolution or any worsening concerns. Patient verbalized understanding and agreed to plan.   ____________________________________________   FINAL CLINICAL IMPRESSION(S) / ED DIAGNOSES  Final diagnoses:  Acute maxillary sinusitis, recurrence not specified     New Prescriptions   BENZONATATE (TESSALON PERLES) 100 MG CAPSULE    Take 1 capsule (100 mg total) by mouth 3 (three) times daily as needed for cough.   DOXYCYCLINE (VIBRAMYCIN) 100 MG CAPSULE    Take 1 capsule (100 mg total) by mouth 2 (two) times daily.    Note: This dictation was prepared with Dragon dictation along with smaller phrase  technology. Any transcriptional errors that result from this process are unintentional.    Clinical Course      Marylene Land, NP 05/18/16 1523

## 2016-05-18 NOTE — ED Triage Notes (Signed)
Patient c/o sinus congestion and pressure, sore throat, sinus drainage, and cough for a week.  Patient denies fevers.

## 2016-05-18 NOTE — Discharge Instructions (Signed)
Take medication as prescribed. Rest. Drink plenty of fluids.  ° °Follow up with your primary care physician this week as needed. Return to Urgent care for new or worsening concerns.  ° °

## 2016-07-06 ENCOUNTER — Encounter: Payer: Self-pay | Admitting: Gynecology

## 2016-07-06 ENCOUNTER — Ambulatory Visit
Admission: EM | Admit: 2016-07-06 | Discharge: 2016-07-06 | Disposition: A | Discharge status: Home / Self Care | Payer: PPO | Attending: Family Medicine | Admitting: Family Medicine

## 2016-07-06 ENCOUNTER — Ambulatory Visit (INDEPENDENT_AMBULATORY_CARE_PROVIDER_SITE_OTHER): Payer: PPO

## 2016-07-06 DIAGNOSIS — S86912A Strain of unspecified muscle(s) and tendon(s) at lower leg level, left leg, initial encounter: Secondary | ICD-10-CM

## 2016-07-06 DIAGNOSIS — M25562 Pain in left knee: Secondary | ICD-10-CM | POA: Diagnosis not present

## 2016-07-06 MED ORDER — MELOXICAM 15 MG PO TABS: 15.0000 mg | ORAL_TABLET | Freq: Every day | ORAL | 0 refills | Status: DC

## 2016-07-06 NOTE — Discharge Instructions (Signed)
Avoid symptoms by using crutches or cane. Take anti-inflammatory medications daily as prescribed. May use heat alternating with ice for comfort. If you're not improved within 2 weeks make an appointment with orthopedic surgery as provided.

## 2016-07-06 NOTE — ED Triage Notes (Signed)
Per patient twisted left knee over  A month ago and now with left knee pain.

## 2016-07-06 NOTE — ED Provider Notes (Signed)
CSN: [id number]     Arrival date & time 07/06/16  1330 History   First MD Initiated Contact with Patient 07/06/16 1452     Chief Complaint  Patient presents with  . Knee Pain   (Consider location/radiation/quality/duration/timing/severity/associated sxs/prior Treatment) HPI  [name] presents with a 1 month H/O left knee pain after a twisting injury. She was exiting a car,stood on her foot and twisted to get out injuring her knee. Since then, she has had swelling and popliteal pain. Has a definate limp. Not improving.      Past Medical History:  Diagnosis Date  . Anxiety    Past Surgical History:  Procedure Laterality Date  . BUNIONECTOMY Right   . CHOLECYSTECTOMY    . EYE SURGERY Bilateral    Cataract removed  . GALLBLADDER SURGERY    . SKIN CANCER EXCISION     face  . SKIN CANCER EXCISION Left 08/22/2013   left nostril area   Family History  Problem Relation Age of Onset  . Cancer Mother     pancreatic  . Diabetes Mother   . Kidney Stones Mother   . COPD Father   . Diabetes Father   . Macular degeneration Father   . Breast cancer Sister 61  . Cancer Brother     Leukemia   Social History  Substance Use Topics  . Smoking status: Never Smoker  . Smokeless tobacco: Never Used  . Alcohol use Yes     Comment: rarely   OB History    No data available     Review of Systems  Constitutional: Positive for activity change. Negative for chills, fatigue and fever.  Musculoskeletal: Positive for arthralgias, gait problem and joint swelling.  All other systems reviewed and are negative.   Allergies  Penicillin v potassium and Penicillins  Home Medications   Prior to Admission medications   Medication Sig Start Date End Date Taking? Authorizing Provider  ALPRAZolam Duanne Moron) 0.5 MG tablet Take 1 tablet (0.5 mg total) by mouth 2 (two) times daily as needed for anxiety. 12/06/15  Yes Richard Maceo Pro., MD  buPROPion (WELLBUTRIN XL) 300 MG 24 hr tablet Take 1  tablet (300 mg total) by mouth daily. 12/06/15  Yes Richard Maceo Pro., MD  traZODone (DESYREL) 150 MG tablet Take 1 tablet (150 mg total) by mouth at bedtime. 12/06/15  Yes Richard Maceo Pro., MD  benzonatate (TESSALON PERLES) 100 MG capsule Take 1 capsule (100 mg total) by mouth 3 (three) times daily as needed for cough. 05/18/16   Marylene Land, NP  doxycycline (VIBRAMYCIN) 100 MG capsule Take 1 capsule (100 mg total) by mouth 2 (two) times daily. 05/18/16   Marylene Land, NP  meloxicam (MOBIC) 15 MG tablet Take 1 tablet (15 mg total) by mouth daily. 07/06/16   Lorin Picket, PA-C  promethazine (PHENERGAN) 25 MG tablet Take 1 tablet (25 mg total) by mouth every 8 (eight) hours as needed for nausea or vomiting. 12/06/15   Jerrol Banana., MD   Meds Ordered and Administered this Visit  Medications - No data to display  BP 129/74 (BP Location: Left Arm)   Pulse 77   Temp 97.5 F (36.4 C) (Oral)   Resp 16   Ht 5\' 5"  (1.651 m)   Wt 165 lb (74.8 kg)   LMP  (LMP Unknown)   SpO2 99%   BMI 27.46 kg/m  No data found.   Physical Exam  Constitutional: She is  oriented to person, place, and time. She appears well-developed and well-nourished. No distress.  HENT:  Head: Normocephalic and atraumatic.  Eyes: EOM are normal. Pupils are equal, round, and reactive to light.  Neck: Normal range of motion. Neck supple.  Musculoskeletal: She exhibits edema and tenderness. She exhibits no deformity.  Examination of the left knee shows a 1+ effusion. Good quad strength. No patellar tenderness. Maximal tenderness is along the joint lines and popliteal fossa. No aneurysm is palpated. Ligaments intact. Negative drawer sign.  Neurological: She is alert and oriented to person, place, and time.  Skin: Skin is warm and dry. She is not diaphoretic.  Psychiatric: She has a normal mood and affect. Her behavior is normal. Judgment and thought content normal.  Nursing note and vitals reviewed.   Urgent  Care Course   Clinical Course    Procedures (including critical care time)  Labs Review Labs Reviewed - No data to display  Imaging Review Dg Knee Complete 4 Views Left  Result Date: 07/06/2016 CLINICAL DATA:  Left knee pain following twisting injury 1 month ago, initial encounter EXAM: LEFT KNEE - COMPLETE 4+ VIEW COMPARISON:  None. FINDINGS: No evidence of fracture, dislocation, or joint effusion. No evidence of arthropathy or other focal bone abnormality. Soft tissues are unremarkable. IMPRESSION: No acute abnormality noted. Electronically Signed   By: Inez Catalina M.D.   On: 07/06/2016 15:26     Visual Acuity Review  Right Eye Distance:   Left Eye Distance:   Bilateral Distance:    Right Eye Near:   Left Eye Near:    Bilateral Near:         MDM   1. Strain of left knee, initial encounter    Current Discharge Medication List    Plan: 1. Test/x-ray results and diagnosis reviewed with patient 2. rx as per orders; risks, benefits, potential side effects reviewed with patient 3. Recommend supportive treatment with Symptom avoidance and rest as necessary. Recommend use of cane or crutch to unload the knee. We'll place on nonsteroidal anti-inflammatory drugs for pain. I commended he's not improved in 2 weeks she should follow-up with orthopedics. Address and phone number was provided to the patient. She will arrange the appointment. She may use ice or heat as necessary for comfort. 4. F/u prn if symptoms worsen or don't improve     Lorin Picket, PA-C 07/06/16 1601

## 2016-08-27 ENCOUNTER — Telehealth: Payer: Self-pay | Admitting: Family Medicine

## 2016-08-27 NOTE — Telephone Encounter (Signed)
Called Pt to schedule AWV with [facility] - knb °

## 2016-09-18 NOTE — Telephone Encounter (Signed)
2nd call to Pt to schedule AWV with [facility] - knb

## 2016-09-19 ENCOUNTER — Other Ambulatory Visit: Payer: Self-pay | Admitting: Family Medicine

## 2016-09-19 DIAGNOSIS — F419 Anxiety disorder, unspecified: Secondary | ICD-10-CM

## 2016-09-19 DIAGNOSIS — F329 Major depressive disorder, single episode, unspecified: Secondary | ICD-10-CM

## 2016-09-19 DIAGNOSIS — F32A Depression, unspecified: Secondary | ICD-10-CM

## 2016-09-19 NOTE — Telephone Encounter (Signed)
Has appointment 09/25/2016. Last refill 12/06/2015. Renaldo Fiddler, CMA

## 2016-09-23 NOTE — Telephone Encounter (Signed)
We'll refill at time of appointment in 2 days.

## 2016-09-25 ENCOUNTER — Encounter: Payer: Self-pay | Admitting: Family Medicine

## 2016-09-25 ENCOUNTER — Ambulatory Visit (INDEPENDENT_AMBULATORY_CARE_PROVIDER_SITE_OTHER): Payer: PPO | Admitting: Family Medicine

## 2016-09-25 VITALS — BP 122/86 | HR 82 | Temp 97.9°F | Resp 14 | Ht 66.0 in | Wt 188.0 lb

## 2016-09-25 DIAGNOSIS — E784 Other hyperlipidemia: Secondary | ICD-10-CM | POA: Diagnosis not present

## 2016-09-25 DIAGNOSIS — G4709 Other insomnia: Secondary | ICD-10-CM

## 2016-09-25 DIAGNOSIS — F3289 Other specified depressive episodes: Secondary | ICD-10-CM | POA: Diagnosis not present

## 2016-09-25 DIAGNOSIS — Z23 Encounter for immunization: Secondary | ICD-10-CM

## 2016-09-25 DIAGNOSIS — Z0000 Encounter for general adult medical examination without abnormal findings: Secondary | ICD-10-CM | POA: Diagnosis not present

## 2016-09-25 DIAGNOSIS — F419 Anxiety disorder, unspecified: Secondary | ICD-10-CM

## 2016-09-25 DIAGNOSIS — E7849 Other hyperlipidemia: Secondary | ICD-10-CM

## 2016-09-25 DIAGNOSIS — Z1159 Encounter for screening for other viral diseases: Secondary | ICD-10-CM | POA: Diagnosis not present

## 2016-09-25 MED ORDER — CLONAZEPAM 0.5 MG PO TABS: 0.5000 mg | ORAL_TABLET | Freq: Two times a day (BID) | ORAL | 1 refills | Status: DC | PRN

## 2016-09-25 MED ORDER — TRAZODONE HCL 150 MG PO TABS: 150.0000 mg | ORAL_TABLET | Freq: Every day | ORAL | 3 refills | Status: DC

## 2016-09-25 NOTE — Progress Notes (Signed)
Patient: Yvette Rogers, Female    DOB: [birth year], 71 y.o.   MRN: [id number] Visit Date: 09/25/2016  Today's Provider: [name], MD   Chief Complaint  Patient presents with  . Medicare Wellness   Subjective:   Yvette Rogers is a 71 y.o. female who presents today for her Subsequent Annual Wellness Visit. She feels fairly well. She reports exercising not right now, she is recovering from foot surgery. She reports she is sleeping having issues with sleep, she is taking trazodone and Xanax as needed. She has a lot of family health issues and stress that is affecting her sleep and emotional state Immunization History  Administered Date(s) Administered  . Influenza, Seasonal, Injecte, Preservative Fre 07/10/2016  . PPD Test 12/11/2015, 12/25/2015  . Pneumococcal Conjugate-13 09/06/2015  . Tdap 02/28/2013  . Zoster 02/28/2013   Patient sees gynecologist and last pap smear was around spring of 2017 and it was normal per patient. She had BMD done before years ago through life screening and it was normal per patient.  Last Colonoscopy 08/22/13 internal hemorrhoids, repeat 5 years-2019.  Mammogram 10/26/15 normal. Depression screen PHQ 2/9 09/25/2016  Decreased Interest 2  Down, Depressed, Hopeless 2  PHQ - 2 Score 4  Altered sleeping 3  Tired, decreased energy 2  Change in appetite 2  Feeling bad or failure about yourself  2  Trouble concentrating 3  Moving slowly or fidgety/restless 2  Suicidal thoughts 0  PHQ-9 Score 18  Difficult doing work/chores Somewhat difficult   Review of Systems  Constitutional: Positive for activity change.  HENT: Positive for congestion.   Eyes: Positive for photophobia.  Respiratory: Positive for chest tightness.   Cardiovascular: Positive for leg swelling.  Gastrointestinal: Positive for constipation.  Endocrine: Positive for polydipsia.  Genitourinary: Negative.   Musculoskeletal: Positive for arthralgias and back pain.  Skin: Negative.    Allergic/Immunologic: Positive for environmental allergies.  Neurological: Positive for dizziness and headaches.  Hematological: Negative.   Psychiatric/Behavioral: Positive for agitation, decreased concentration, dysphoric mood and sleep disturbance. The patient is nervous/anxious.   All other systems reviewed and are negative.   Patient Active Problem List   Diagnosis Date Noted  . Anxiety 03/23/2015  . Body mass index 28.0-28.9, adult 03/23/2015  . Clinical depression 03/23/2015  . 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 [place] (subconjunctival hemorrhage) 03/23/2015  . Tenosynovitis 03/23/2015  . Variants of migraine 09/13/2009  . Adjustment disorder with mixed anxiety and depressed mood 06/11/2009    Social History   Social History  . Marital status: Married    Spouse name: N/A  . Number of children: N/A  . Years of education: N/A   Occupational History  . Not on file.   Social History Main Topics  . Smoking status: Never Smoker  . Smokeless tobacco: Never Used  . Alcohol use No  . Drug use: No  . Sexual activity: Not on file   Other Topics Concern  . Not on file   Social History Narrative  . No narrative on file    Past Surgical History:  Procedure Laterality Date  . BUNIONECTOMY Right   . CHOLECYSTECTOMY    . EYE SURGERY Bilateral    Cataract removed  . GALLBLADDER SURGERY    . SKIN CANCER EXCISION     face  . SKIN CANCER EXCISION Left 08/22/2013   left nostril area    Her family history includes Breast cancer (age of onset: 74) in her  sister; COPD in her father; Cancer in her brother and mother; Diabetes in her father and mother; Kidney Stones in her mother; Macular degeneration in her father.     Outpatient Encounter Prescriptions as of 09/25/2016  Medication Sig Note  . ALPRAZolam (XANAX) 0.5 MG tablet take 1 tablet by mouth twice a day if needed for anxiety   . buPROPion (WELLBUTRIN XL) 300 MG 24 hr tablet  Take 1 tablet (300 mg total) by mouth daily.   . meloxicam (MOBIC) 15 MG tablet Take 1 tablet (15 mg total) by mouth daily.   . traZODone (DESYREL) 150 MG tablet Take 1 tablet (150 mg total) by mouth at bedtime.   Marland Kitchen FLUVIRIN SUSP    . promethazine (PHENERGAN) 25 MG tablet Take 1 tablet (25 mg total) by mouth every 8 (eight) hours as needed for nausea or vomiting. (Patient not taking: Reported on 09/25/2016) 09/25/2016: Out of the RX  . [DISCONTINUED] benzonatate (TESSALON PERLES) 100 MG capsule Take 1 capsule (100 mg total) by mouth 3 (three) times daily as needed for cough.   . [DISCONTINUED] doxycycline (VIBRAMYCIN) 100 MG capsule Take 1 capsule (100 mg total) by mouth 2 (two) times daily.    No facility-administered encounter medications on file as of 09/25/2016.     Allergies  Allergen Reactions  . Penicillin V Potassium Hives  . Penicillins Hives    Patient Care Team: Jerrol Banana., MD as PCP - General (Family Medicine)   Objective:   Vitals:  Vitals:   09/25/16 0858  BP: 122/86  Pulse: 82  Resp: 14  Temp: 97.9 F (36.6 C)  Weight: 188 lb (85.3 kg)  Height: 5\' 6"  (1.676 m)    Physical Exam  Constitutional: She is oriented to person, place, and time. She appears well-developed and well-nourished.  HENT:  Head: Normocephalic and atraumatic.  Eyes: Conjunctivae are normal. Pupils are equal, round, and reactive to light.  Neck: Normal range of motion. Neck supple.  Cardiovascular: Normal rate, regular rhythm, normal heart sounds and intact distal pulses.   No murmur heard. Pulmonary/Chest: Effort normal and breath sounds normal. No respiratory distress. She has no wheezes.  Musculoskeletal: She exhibits no edema or tenderness.  Neurological: She is alert and oriented to person, place, and time.  Skin: No rash noted. No erythema.    Activities of Daily Living In your present state of health, do you have any difficulty performing the following activities:  09/25/2016  Hearing? N  Vision? N  Difficulty concentrating or making decisions? Y  Walking or climbing stairs? N  Dressing or bathing? N  Doing errands, shopping? N  Some recent data might be hidden    Fall Risk Assessment Fall Risk  09/25/2016 12/06/2015 09/06/2015  Falls in the past year? No No No     Depression Screen PHQ 2/9 Scores 09/25/2016 12/06/2015 09/06/2015  PHQ - 2 Score 4 5 6   PHQ- 9 Score 18 20 24     Cognitive Testing - 6-CIT    Year: 0 4 points  Month: 0 3 points  Memorize "Pia Mau, [address] [place]"  Time (within 1 hour:) 0 3 points  Count backwards from 20: 0 2 4 points  Name months of year: 0 2 4 points  Repeat Address: 0 2 4 6 8 10  points   Total Score: 2/28  Interpretation : Normal (0-7) Abnormal (8-28)    Assessment & Plan:     Annual Wellness Visit  Reviewed patient's Family Medical History  Reviewed and updated list of patient's medical providers Assessment of cognitive impairment was done Assessed patient's functional ability Established a written schedule for health screening [place] Completed and Reviewed  Discussed health benefits of physical activity, and encouraged her to engage in regular exercise appropriate for her age and condition.  1. Medicare annual wellness visit, subsequent Sees gynecologist and pap smear and mammogram are up to date. - CBC w/Diff/Platelet - Comprehensive metabolic panel - Lipid Panel With LDL/HDL Ratio - TSH  2. Other insomnia Refill given. - traZODone (DESYREL) 150 MG tablet; Take 1 tablet (150 mg total) by mouth at bedtime.  Dispense: 90 tablet; Refill: 3 - TSH - Ambulatory referral to Psychiatry  3. Anxiety Patient is struggling. Refer to psychiatrist Dr Nicolasa Ducking. Switch Xanax to Clonazepam. - Ambulatory referral to Psychiatry  4. Other depression See above plan. - CBC w/Diff/Platelet - Comprehensive metabolic panel - Ambulatory referral to Psychiatry  5. Need for  pneumococcal vaccination Administered Pneumovax.  6. Need for hepatitis C screening test - Hepatitis C Antibody  7. Other hyperlipidemia - Comprehensive metabolic panel - Lipid Panel With LDL/HDL Ratio  HPI, Exam and A&P transcribed under direction and in the presence of Miguel Aschoff, MD. I have done the exam and reviewed the chart and it is accurate to the best of my knowledge. Development worker, community has been used and  any errors in dictation or transcription are unintentional. Miguel Aschoff M.D. [facility]

## 2016-12-09 IMAGING — MR MR FOOT*L* W/O CM
5 series · 40 of 40 positions shown · non-contrast
Comparison: Radiographs dated 11/01/2014

CLINICAL DATA: Chronic heel pain.  Plantar fasciitis.  Neuritis.

EXAM:
MRI OF THE LEFT HINDFOOT WITHOUT CONTRAST
TECHNIQUE: Multiplanar, multisequence MR imaging was performed. No intravenous
contrast was administered.

[Series 3: PD fat-sat · axial · 3.0mm · 0.50mm/px · z∈[-63,+59]mm · 9 of 38 slices shown]
[im 1/38]
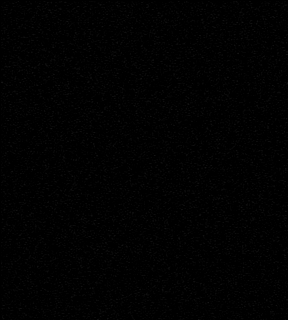
[im 5/38]
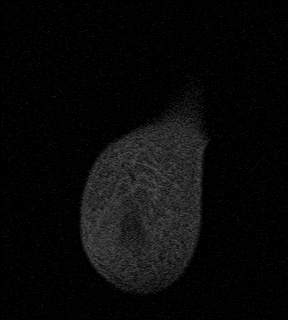
[im 10/38]
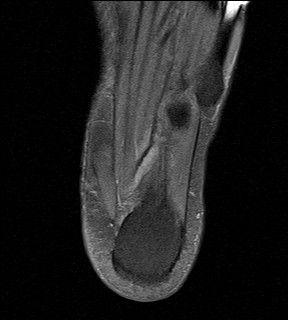
[im 14/38]
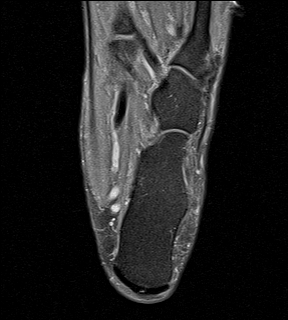
[im 19/38]
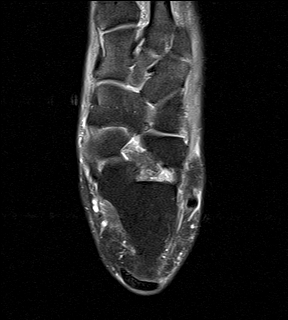
[im 24/38]
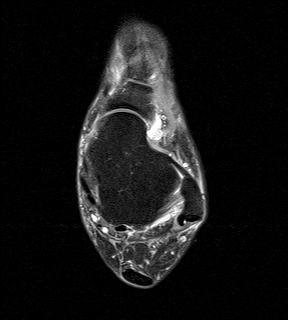
[im 28/38]
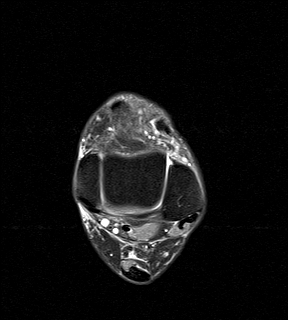
[im 33/38]
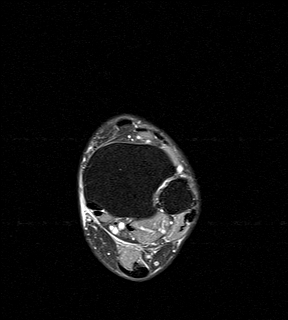
[im 38/38]
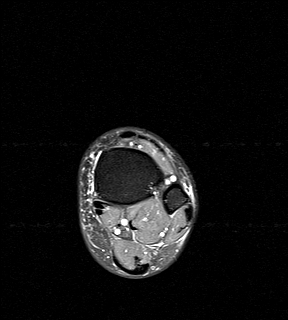

[Series 4: T2 fat-sat · axial · 3.0mm · 0.50mm/px · z∈[-63,+59]mm · 9 of 38 slices shown (1 of 3)]
[im 1/38]
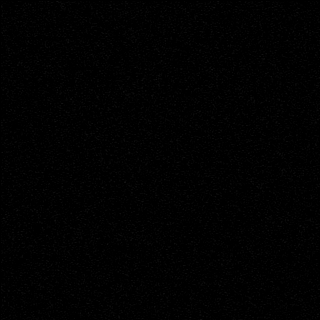
[im 5/38]
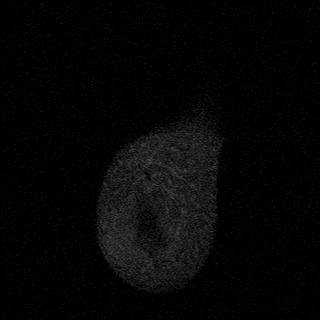
[im 10/38]
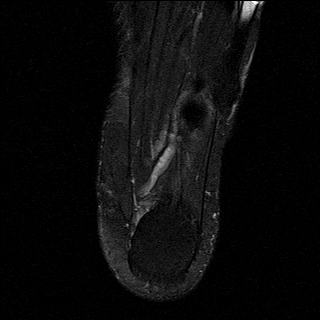
[im 14/38]
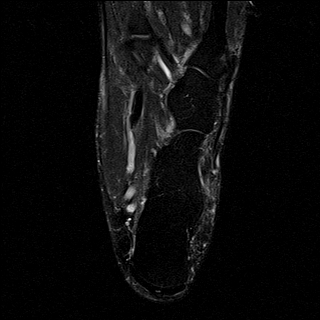
[im 19/38]
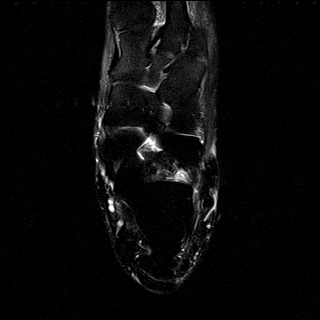
[im 24/38]
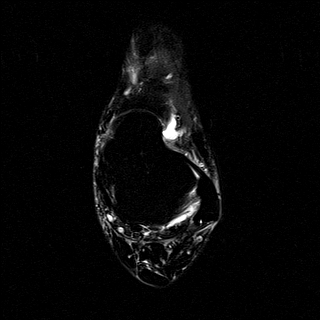
[im 28/38]
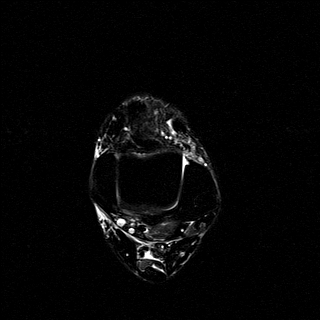
[im 33/38]
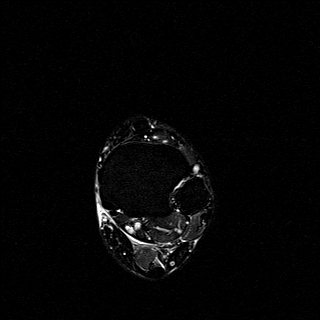
[im 38/38]
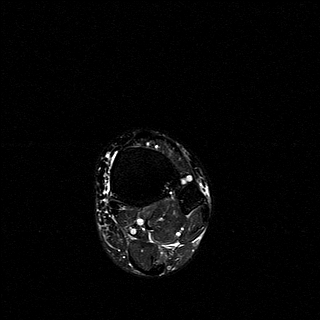

[Series 5: T2 fat-sat · coronal · 3.0mm · 0.56mm/px · 10 of 47 slices shown (2 of 3)]
[im 1/47]
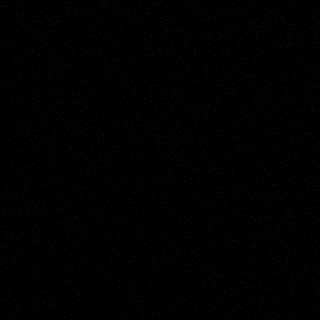
[im 6/47]
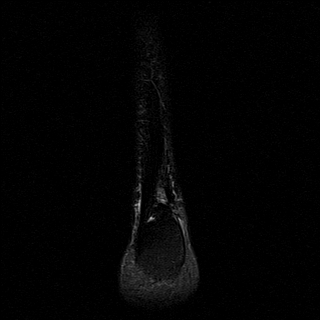
[im 11/47]
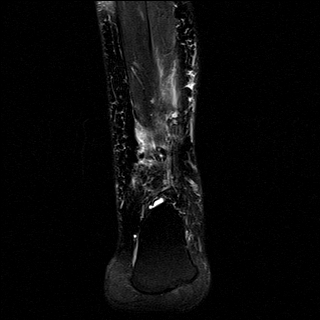
[im 16/47]
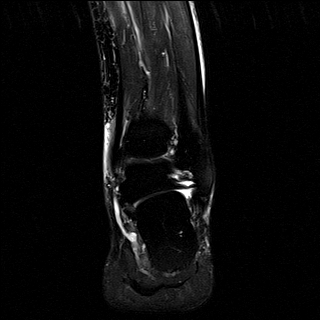
[im 21/47]
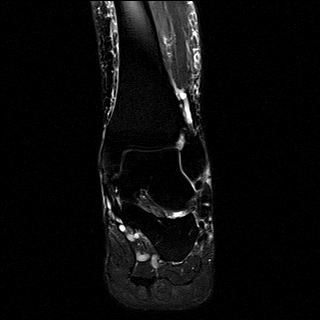
[im 26/47]
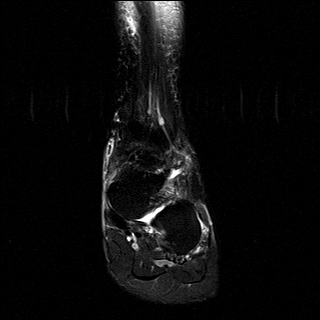
[im 31/47]
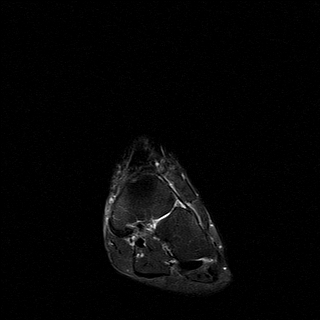
[im 36/47]
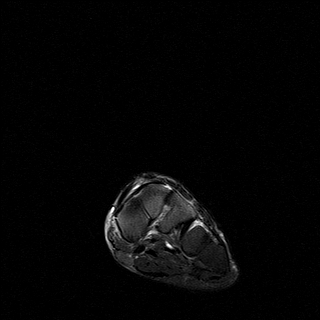
[im 41/47]
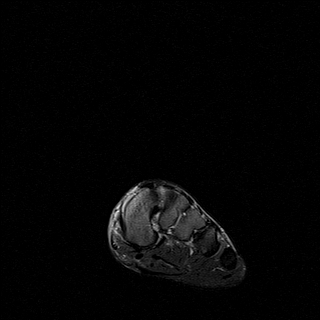
[im 47/47]
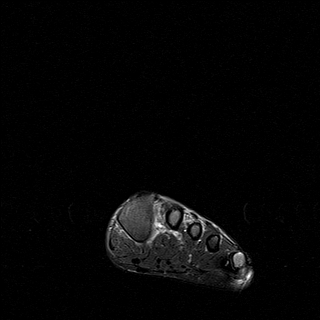

[Series 6: T1 · sagittal · 3.0mm · 0.56mm/px · 6 of 29 slices shown]
[im 1/29]
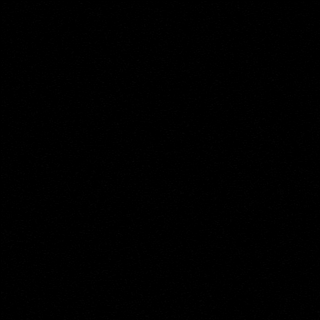
[im 6/29]
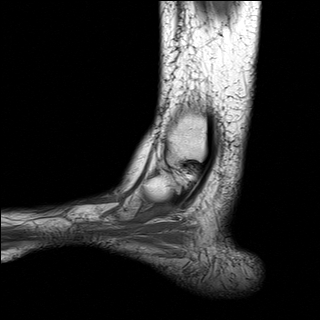
[im 12/29]
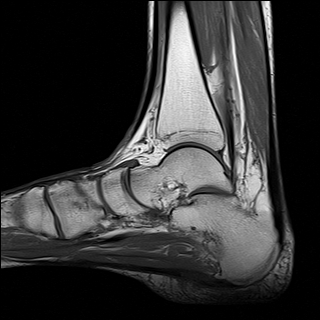
[im 17/29]
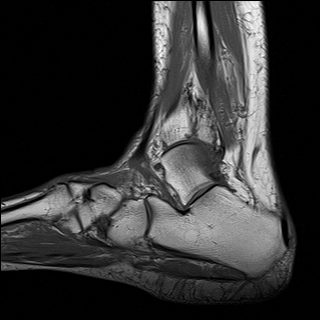
[im 23/29]
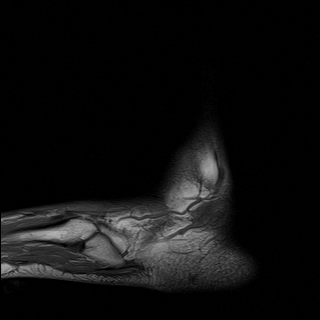
[im 29/29]
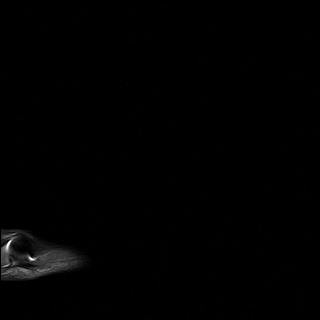

[Series 7: T2 fat-sat · sagittal · 3.0mm · 0.56mm/px · 6 of 29 slices shown (3 of 3)]
[im 1/29]
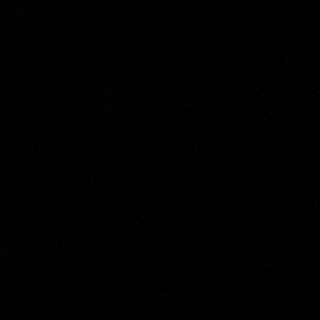
[im 6/29]
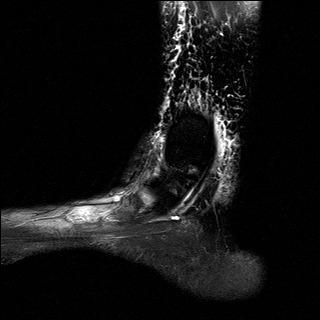
[im 12/29]
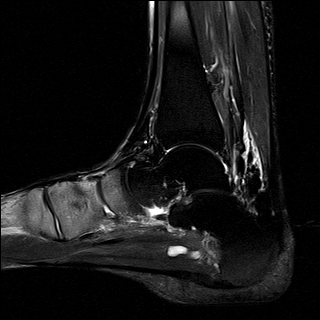
[im 17/29]
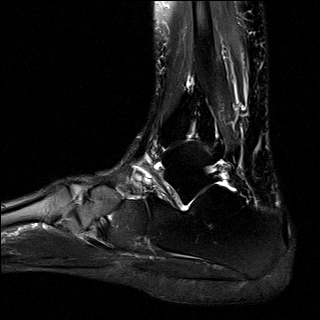
[im 23/29]
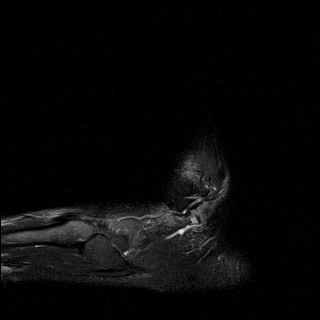
[im 29/29]
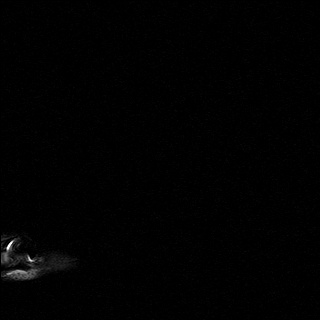

[40 of 40 positions shown; findings below may reference images not displayed]

FINDINGS: TENDONS

Peroneal: Normal.

Posteromedial: Normal.

Anterior: Normal.

Achilles: Normal.

Plantar Fascia: There is slight hypertrophy and degeneration of the
proximal aspect of the medial band of the plantar fascia. There is
no edema in the adjacent calcaneus or around the hypertrophied
degenerated medial band.

LIGAMENTS

Lateral: Intact. Chronic thickening of the anterior talofibular
ligament consistent with previous sprain.

Medial: Normal.

CARTILAGE

Ankle Joint: Normal.  No joint effusion.

Subtalar Joints/Sinus Tarsi: Normal.  Spring ligament is intact.

Bones: Small posterior and plantar calcaneal enthesophytes.
IMPRESSION: Hypertrophy and degeneration of the medial band of the plantar
fascia without evidence of active inflammation.

No other significant abnormalities.

## 2017-02-05 ENCOUNTER — Encounter: Payer: PPO | Admitting: Obstetrics and Gynecology

## 2017-03-26 ENCOUNTER — Ambulatory Visit (INDEPENDENT_AMBULATORY_CARE_PROVIDER_SITE_OTHER): Payer: PPO | Admitting: Family Medicine

## 2017-03-26 VITALS — BP 122/80 | HR 88 | Temp 97.7°F | Resp 16 | Wt 191.0 lb

## 2017-03-26 DIAGNOSIS — F331 Major depressive disorder, recurrent, moderate: Secondary | ICD-10-CM | POA: Diagnosis not present

## 2017-03-26 DIAGNOSIS — E784 Other hyperlipidemia: Secondary | ICD-10-CM

## 2017-03-26 DIAGNOSIS — Z6830 Body mass index (BMI) 30.0-30.9, adult: Secondary | ICD-10-CM

## 2017-03-26 DIAGNOSIS — Z1159 Encounter for screening for other viral diseases: Secondary | ICD-10-CM | POA: Diagnosis not present

## 2017-03-26 DIAGNOSIS — M25571 Pain in right ankle and joints of right foot: Secondary | ICD-10-CM

## 2017-03-26 DIAGNOSIS — F419 Anxiety disorder, unspecified: Secondary | ICD-10-CM

## 2017-03-26 DIAGNOSIS — M79672 Pain in left foot: Secondary | ICD-10-CM | POA: Diagnosis not present

## 2017-03-26 DIAGNOSIS — M79671 Pain in right foot: Secondary | ICD-10-CM

## 2017-03-26 DIAGNOSIS — M25572 Pain in left ankle and joints of left foot: Secondary | ICD-10-CM

## 2017-03-26 DIAGNOSIS — G4709 Other insomnia: Secondary | ICD-10-CM

## 2017-03-26 DIAGNOSIS — E7849 Other hyperlipidemia: Secondary | ICD-10-CM

## 2017-03-26 MED ORDER — CLONAZEPAM 0.5 MG PO TABS: 0.5000 mg | ORAL_TABLET | Freq: Two times a day (BID) | ORAL | 5 refills | Status: DC | PRN

## 2017-03-26 MED ORDER — TRAZODONE HCL 150 MG PO TABS: 150.0000 mg | ORAL_TABLET | Freq: Every day | ORAL | 3 refills | Status: DC

## 2017-03-26 MED ORDER — MELOXICAM 15 MG PO TABS: 15.0000 mg | ORAL_TABLET | Freq: Every day | ORAL | 5 refills | Status: DC | PRN

## 2017-03-26 MED ORDER — BUPROPION HCL ER (XL) 300 MG PO TB24: 300.0000 mg | ORAL_TABLET | Freq: Every day | ORAL | 3 refills | Status: DC

## 2017-03-26 NOTE — Progress Notes (Signed)
Yvette Rogers  MRN: [id number] DOB: 1945-12-05  Subjective:  HPI  Patient is here for 6 months follow up. Last office visit was on 09/25/16 for wellness visit. For depression and anxiety refer patient to psychiatrist. Switched Xanax to Clonazepam instead. Patient did not a see Dr Nicolasa Ducking she has been going to counseling program through work instead, it is free to her. She has been taking Clonazepam as needed instead of Xanax, Clonazepam does not seem to work as well as Xanax, does not help to calm her down as much. Takes Clonazepam about every other day. She is seen counseling through work once a week. She was feeling better since the beginning of the year but lately it has been harder emotionally. Also patient needs refill on Trazodone. She takes this every night, she still has trouble with sleeping while taking the medication, wakes up a lot. Depression screen [facility] 2/9 03/26/2017 03/26/2017 09/25/2016  Decreased Interest 2 2 2   Down, Depressed, Hopeless 2 2 2   PHQ - 2 Score 4 4 4   Altered sleeping 3 3 3   Tired, decreased energy 2 2 2   Change in appetite 1 1 2   Feeling bad or failure about yourself  2 2 2   Trouble concentrating 1 1 3   Moving slowly or fidgety/restless 1 1 2   Suicidal thoughts 0 0 0  PHQ-9 Score 14 14 18   Difficult doing work/chores - Somewhat difficult Somewhat difficult   Wt Readings from Last 3 Encounters:  03/26/17 191 lb (86.6 kg)  09/25/16 188 lb (85.3 kg)  07/06/16 165 lb (74.8 kg)   BP Readings from Last 3 Encounters:  03/26/17 122/80  09/25/16 122/86  07/06/16 129/74   Patient also needs refill on Meloxicam that she takes daily for joint pain-feet.  Patient Active Problem List   Diagnosis Date Noted  . Other insomnia 09/25/2016  . Anxiety 03/23/2015  . Body mass index 28.0-28.9, adult 03/23/2015  . Clinical depression 03/23/2015  . 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 [employer] (subconjunctival hemorrhage) 03/23/2015   . Tenosynovitis 03/23/2015  . Variants of migraine 09/13/2009  . Adjustment disorder with mixed anxiety and depressed mood 06/11/2009    Past Medical History:  Diagnosis Date  . Anxiety     Social History   Social History  . Marital status: Married    Spouse name: N/A  . Number of children: N/A  . Years of education: N/A   Occupational History  . Not on file.   Social History Main Topics  . Smoking status: Never Smoker  . Smokeless tobacco: Never Used  . Alcohol use No  . Drug use: No  . Sexual activity: Not on file   Other Topics Concern  . Not on file   Social History Narrative  . No narrative on file    Outpatient Encounter Prescriptions as of 03/26/2017  Medication Sig Note  . buPROPion (WELLBUTRIN XL) 300 MG 24 hr tablet Take 1 tablet (300 mg total) by mouth daily.   . clonazePAM (KLONOPIN) 0.5 MG tablet Take 1 tablet (0.5 mg total) by mouth 2 (two) times daily as needed for anxiety.   . meloxicam (MOBIC) 15 MG tablet Take 1 tablet (15 mg total) by mouth daily.   . traZODone (DESYREL) 150 MG tablet Take 1 tablet (150 mg total) by mouth at bedtime.   Marland Kitchen FLUVIRIN SUSP    . promethazine (PHENERGAN) 25 MG tablet Take 1 tablet (25 mg total) by mouth every 8 (  eight) hours as needed for nausea or vomiting. (Patient not taking: Reported on 09/25/2016) 09/25/2016: Out of the RX   No facility-administered encounter medications on file as of 03/26/2017.     Allergies  Allergen Reactions  . Penicillin V Potassium Hives  . Penicillins Hives    Review of Systems  Constitutional: Positive for malaise/fatigue.  HENT: Negative.   Eyes: Negative.   Respiratory: Negative.   Cardiovascular: Negative.   Gastrointestinal: Negative.   Musculoskeletal: Positive for joint pain (ankles and feet).  Skin: Negative.   Neurological: Negative.        Trouble concentrating on things.  Endo/Heme/Allergies: Negative.   Psychiatric/Behavioral: Positive for depression. The patient is  nervous/anxious and has insomnia.     Objective:  BP 122/80   Pulse 88   Temp 97.7 F (36.5 C)   Resp 16   Wt 191 lb (86.6 kg)   LMP  (LMP Unknown)   BMI 30.83 kg/m   Physical Exam  Constitutional: She is oriented to person, place, and time and well-developed, well-nourished, and in no distress.  HENT:  Head: Normocephalic and atraumatic.  Eyes: Pupils are equal, round, and reactive to light. Conjunctivae are normal.  Neck: Normal range of motion. Neck supple.  Cardiovascular: Normal rate, regular rhythm, normal heart sounds and intact distal pulses.  Exam reveals no gallop.   No murmur heard. Pulmonary/Chest: Effort normal and breath sounds normal. No respiratory distress. She has no wheezes.  Musculoskeletal: She exhibits no edema or tenderness.  Neurological: She is alert and oriented to person, place, and time.  Psychiatric: Mood, memory, affect and judgment normal.   Assessment and Plan :  1. Other insomnia Refill given. - traZODone (DESYREL) 150 MG tablet; Take 1 tablet (150 mg total) by mouth at bedtime.  Dispense: 90 tablet; Refill: 3 - CBC with Differential/Platelet  2. Anxiety Refill given. Advised patient not to take Clonazepam regularly and the risks of taking this medication long term. - buPROPion (WELLBUTRIN XL) 300 MG 24 hr tablet; Take 1 tablet (300 mg total) by mouth daily.  Dispense: 90 tablet; Refill: 3 - CBC with Differential/Platelet  3. Moderate episode of recurrent major depressive disorder [facility]) Patient is not doing better. Encouraged to continue seen a counselor through work. May need to refer to psychiatrist, will re evaluate on the next visit. Re check in 4 months - buPROPion (WELLBUTRIN XL) 300 MG 24 hr tablet; Take 1 tablet (300 mg total) by mouth daily.  Dispense: 90 tablet; Refill: 3  4. Other hyperlipidemia - Comprehensive metabolic panel - Lipid Panel With LDL/HDL Ratio  5. Need for hepatitis C screening test - Hepatitis C Antibody  6.  BMI 30.0-30.9,adult Discussed with patient to work on habits. - CBC with Differential/Platelet - TSH  7. Pain in joints of both feet Refill provided. Patient advised not to take Meloxicam daily, advised patient she can take it 2 times a week or 1 week out of the month. patient understood and will cut back. Follow. - meloxicam (MOBIC) 15 MG tablet; Take 1 tablet (15 mg total) by mouth daily as needed for pain.  Dispense: 30 tablet; Refill: 5  HPI, Exam and A&P transcribed by Theressa Millard, RMA under direction and in the presence of Miguel Aschoff, MD. I have done the exam and reviewed the chart and it is accurate to the best of my knowledge. Development worker, community has been used and  any errors in dictation or transcription are unintentional. Miguel Aschoff M.D. [place]  [facility]  

## 2017-03-27 LAB — COMPREHENSIVE METABOLIC PANEL
ALBUMIN: 4.2 g/dL (ref 3.6–4.8)
ALK PHOS: 96 IU/L (ref 39–117)
ALT: 21 IU/L (ref 0–32)
AST: 27 IU/L (ref 0–40)
Albumin/Globulin Ratio: 1.4 (ref 1.2–2.2)
BILIRUBIN TOTAL: 0.4 mg/dL (ref 0.0–1.2)
BUN / CREAT RATIO: 17 (ref 12–28)
BUN: 13 mg/dL (ref 8–27)
CHLORIDE: 104 mmol/L (ref 96–106)
CO2: 25 mmol/L (ref 20–29)
CREATININE: 0.78 mg/dL (ref 0.57–1.00)
Calcium: 9.3 mg/dL (ref 8.7–10.3)
GFR calc Af Amer: 90 mL/min/{1.73_m2} (ref >59)
GFR calc non Af Amer: 78 mL/min/{1.73_m2} (ref >59)
GLOBULIN, TOTAL: 2.9 g/dL (ref 1.5–4.5)
Glucose: 92 mg/dL (ref 65–99)
Potassium: 4.6 mmol/L (ref 3.5–5.2)
SODIUM: 142 mmol/L (ref 134–144)
Total Protein: 7.1 g/dL (ref 6.0–8.5)

## 2017-03-27 LAB — CBC WITH DIFFERENTIAL/PLATELET
Basophils Absolute: 0.1 10*3/uL (ref 0.0–0.2)
Basos: 1 %
EOS (ABSOLUTE): 0.1 10*3/uL (ref 0.0–0.4)
EOS: 2 %
HEMATOCRIT: 37.5 % (ref 34.0–46.6)
HEMOGLOBIN: 12.9 g/dL (ref 11.1–15.9)
Immature Grans (Abs): 0 10*3/uL (ref 0.0–0.1)
Immature Granulocytes: 0 %
LYMPHS ABS: 1.5 10*3/uL (ref 0.7–3.1)
Lymphs: 36 %
MCH: 31.9 pg (ref 26.6–33.0)
MCHC: 34.4 g/dL (ref 31.5–35.7)
MCV: 93 fL (ref 79–97)
MONOCYTES: 8 %
Monocytes Absolute: 0.3 10*3/uL (ref 0.1–0.9)
NEUTROS ABS: 2.3 10*3/uL (ref 1.4–7.0)
Neutrophils: 53 %
Platelets: 252 10*3/uL (ref 150–379)
RBC: 4.05 x10E6/uL (ref 3.77–5.28)
RDW: 14.4 % (ref 12.3–15.4)
WBC: 4.3 10*3/uL (ref 3.4–10.8)

## 2017-03-27 LAB — LIPID PANEL WITH LDL/HDL RATIO
Cholesterol, Total: 233 mg/dL — ABNORMAL HIGH (ref 100–199)
HDL: 56 mg/dL (ref >39)
LDL CALC: 137 mg/dL — AB (ref 0–99)
LDl/HDL Ratio: 2.4 ratio (ref 0.0–3.2)
Triglycerides: 200 mg/dL — ABNORMAL HIGH (ref 0–149)
VLDL Cholesterol Cal: 40 mg/dL (ref 5–40)

## 2017-03-27 LAB — TSH: TSH: 2.01 u[IU]/mL (ref 0.450–4.500)

## 2017-03-27 LAB — HEPATITIS C ANTIBODY: Hep C Virus Ab: <0.1 s/co ratio (ref 0.0–0.9)

## 2017-04-03 ENCOUNTER — Ambulatory Visit (INDEPENDENT_AMBULATORY_CARE_PROVIDER_SITE_OTHER): Payer: PPO | Admitting: Family Medicine

## 2017-04-03 ENCOUNTER — Encounter: Payer: Self-pay | Admitting: Family Medicine

## 2017-04-03 VITALS — BP 116/72 | HR 79 | Temp 97.5°F

## 2017-04-03 DIAGNOSIS — R300 Dysuria: Secondary | ICD-10-CM

## 2017-04-03 LAB — POCT URINALYSIS DIPSTICK
Bilirubin, UA: NEGATIVE
GLUCOSE UA: NEGATIVE
KETONES UA: NEGATIVE
NITRITE UA: NEGATIVE
Protein, UA: NEGATIVE
Spec Grav, UA: 1.015 (ref 1.010–1.025)
Urobilinogen, UA: 0.2 E.U./dL
pH, UA: 6 (ref 5.0–8.0)

## 2017-04-03 MED ORDER — SULFAMETHOXAZOLE-TRIMETHOPRIM 800-160 MG PO TABS: 1.0000 | ORAL_TABLET | Freq: Two times a day (BID) | ORAL | 0 refills | Status: DC

## 2017-04-03 NOTE — Patient Instructions (Signed)

## 2017-04-03 NOTE — Progress Notes (Signed)
Patient: Yvette Rogers Female    DOB: July 08, 1946   71 y.o.   MRN: [id number] Visit Date: 04/03/2017  Today's Provider: Vernie Murders, PA   Chief Complaint  Patient presents with  . Dysuria   Subjective:    Dysuria   This is a new problem. Episode onset: 2 days ago. The problem occurs intermittently. The problem has been unchanged. The quality of the pain is described as burning. Associated symptoms include hesitancy and urgency. Treatments tried: Cystex. The treatment provided mild relief.   Patient Active Problem List   Diagnosis Date Noted  . Other insomnia 09/25/2016  . Anxiety 03/23/2015  . Body mass index 28.0-28.9, adult 03/23/2015  . Clinical depression 03/23/2015  . 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 [place] (subconjunctival hemorrhage) 03/23/2015  . Tenosynovitis 03/23/2015  . Variants of migraine 09/13/2009  . Adjustment disorder with mixed anxiety and depressed mood 06/11/2009   Past Surgical History:  Procedure Laterality Date  . BUNIONECTOMY Right   . CHOLECYSTECTOMY    . EYE SURGERY Bilateral    Cataract removed  . GALLBLADDER SURGERY    . SKIN CANCER EXCISION     face  . SKIN CANCER EXCISION Left 08/22/2013   left nostril area   Family History  Problem Relation Age of Onset  . Cancer Mother        pancreatic  . Diabetes Mother   . Kidney Stones Mother   . COPD Father   . Diabetes Father   . Macular degeneration Father   . Breast cancer Sister 51  . Cancer Brother        Leukemia   Allergies  Allergen Reactions  . Penicillin V Potassium Hives  . Penicillins Hives     Previous Medications   BUPROPION (WELLBUTRIN XL) 300 MG 24 HR TABLET    Take 1 tablet (300 mg total) by mouth daily.   CLONAZEPAM (KLONOPIN) 0.5 MG TABLET    Take 1 tablet (0.5 mg total) by mouth 2 (two) times daily as needed for anxiety.   FLUVIRIN SUSP       MELOXICAM (MOBIC) 15 MG TABLET    Take 1 tablet (15 mg total) by mouth daily  as needed for pain.   PROMETHAZINE (PHENERGAN) 25 MG TABLET    Take 1 tablet (25 mg total) by mouth every 8 (eight) hours as needed for nausea or vomiting.   TRAZODONE (DESYREL) 150 MG TABLET    Take 1 tablet (150 mg total) by mouth at bedtime.    Review of Systems  Constitutional: Negative.   Respiratory: Negative.   Cardiovascular: Negative.   Genitourinary: Positive for dysuria, hesitancy and urgency.    Social History  Substance Use Topics  . Smoking status: Never Smoker  . Smokeless tobacco: Never Used  . Alcohol use No   Objective:   BP 116/72 (BP Location: Right Arm, Patient Position: Sitting, Cuff Size: Normal)   Pulse 79   Temp (!) 97.5 F (36.4 C) (Oral)   LMP  (LMP Unknown)   SpO2 98%   Physical Exam  Constitutional: She is oriented to person, place, and time. She appears well-developed and well-nourished. No distress.  HENT:  Head: Normocephalic and atraumatic.  Right Ear: Hearing normal.  Left Ear: Hearing normal.  Nose: Nose normal.  Eyes: Conjunctivae and lids are normal. Right eye exhibits no discharge. Left eye exhibits no discharge. No scleral icterus.  Cardiovascular: Normal rate and regular rhythm.   Pulmonary/Chest: Effort  normal and breath sounds normal. No respiratory distress.  Abdominal: Soft. Bowel sounds are normal. She exhibits no mass. There is tenderness. There is no guarding.  Suprapubic pressure discomfort.  Musculoskeletal: Normal range of motion.  Neurological: She is alert and oriented to person, place, and time.  Skin: Skin is intact. No lesion and no rash noted.  Psychiatric: She has a normal mood and affect. Her speech is normal and behavior is normal. Thought content normal.      Assessment & Plan:     1. Dysuria Frequency and burning with urination started 2 days ago. No gross hematuria. Microscopic exam of urine showed pyuria with a few bacteria and RBC's. Increase fluid intake, start Septra-DS and get C&S. Recheck pending  culture report. - POCT Urinalysis Dipstick - Urine Culture - sulfamethoxazole-trimethoprim (BACTRIM DS,SEPTRA DS) 800-160 MG tablet; Take 1 tablet by mouth 2 (two) times daily.  Dispense: 14 tablet; Refill: 0

## 2017-04-06 ENCOUNTER — Telehealth: Payer: Self-pay

## 2017-04-06 LAB — URINE CULTURE

## 2017-04-06 NOTE — Telephone Encounter (Signed)
Patient advised.

## 2017-04-06 NOTE — Telephone Encounter (Signed)
-----   Message from Margo Common, [place] sent at 04/06/2017  7:42 AM EDT ----- Culture isolated E.coli as cause of this UTI and it is susceptible to the antibiotic given. Finish all of it and recheck in 7-10 days if any symptoms remain. Drink plenty of water.

## 2017-04-14 ENCOUNTER — Encounter: Payer: PPO | Admitting: Obstetrics and Gynecology

## 2017-04-25 ENCOUNTER — Encounter: Payer: Self-pay | Admitting: Family Medicine

## 2017-04-25 ENCOUNTER — Ambulatory Visit (INDEPENDENT_AMBULATORY_CARE_PROVIDER_SITE_OTHER): Payer: PPO | Admitting: Family Medicine

## 2017-04-25 VITALS — BP 126/78 | HR 72 | Temp 98.0°F | Resp 16

## 2017-04-25 DIAGNOSIS — N3090 Cystitis, unspecified without hematuria: Secondary | ICD-10-CM

## 2017-04-25 LAB — POCT URINALYSIS DIPSTICK
BILIRUBIN UA: NEGATIVE
Blood, UA: NEGATIVE
GLUCOSE UA: NEGATIVE
KETONES UA: NEGATIVE
Nitrite, UA: NEGATIVE
Protein, UA: NEGATIVE
SPEC GRAV UA: 1.02 (ref 1.010–1.025)
Urobilinogen, UA: 0.2 E.U./dL
pH, UA: 5 (ref 5.0–8.0)

## 2017-04-25 MED ORDER — CIPROFLOXACIN HCL 250 MG PO TABS: 250.0000 mg | ORAL_TABLET | Freq: Two times a day (BID) | ORAL | 0 refills | Status: DC

## 2017-04-25 NOTE — Progress Notes (Signed)
Subjective:     Patient ID: Yvette Rogers, female   DOB: September 29, 1945, 71 y.o.   MRN: [id number]  HPI  Chief Complaint  Patient presents with  . Urinary Tract Infection    Recurrent about three days ago.  Was treated with Septra on 7/20 for a pan- sensitive E.Coli infection. Reports she is not quite sure she got completely better. Developed abdominal and low back pain with mild urinary burning. No fever or chills. Not currently on estrogen.   Review of Systems     Objective:   Physical Exam  Constitutional: She appears well-developed and well-nourished. No distress.  Genitourinary:  Genitourinary Comments: No cva tenderness       Assessment:    1. Cystitis - Urine Culture - POCT urinalysis dipstick - ciprofloxacin (CIPRO) 250 MG tablet; Take 1 tablet (250 mg total) by mouth 2 (two) times daily.  Dispense: 14 tablet; Refill: 0    Plan:    Further f/u pending culture results. Suggested she discuss intravaginal hormone therapy with her gyn if recurrent UTI's.

## 2017-04-25 NOTE — Patient Instructions (Signed)
We will call you with the urine culture results. Take each pill with a full glass of water.

## 2017-04-27 ENCOUNTER — Other Ambulatory Visit: Payer: Self-pay | Admitting: Family Medicine

## 2017-04-27 DIAGNOSIS — N3090 Cystitis, unspecified without hematuria: Secondary | ICD-10-CM | POA: Diagnosis not present

## 2017-04-29 LAB — URINE CULTURE

## 2017-05-01 ENCOUNTER — Telehealth: Payer: Self-pay

## 2017-05-01 NOTE — Telephone Encounter (Signed)
[id number] 

## 2017-05-01 NOTE — Telephone Encounter (Signed)
-----   Message from Carmon Ginsberg, [place] sent at 04/30/2017  1:36 PM EDT ----- No specific organism detected. Is she feeling better?

## 2017-05-05 NOTE — Telephone Encounter (Signed)
Patient advised symptoms have cleared. KW

## 2017-07-11 ENCOUNTER — Ambulatory Visit
Admission: EM | Admit: 2017-07-11 | Discharge: 2017-07-11 | Disposition: A | Discharge status: Home / Self Care | Payer: PPO | Attending: Family Medicine | Admitting: Family Medicine

## 2017-07-11 DIAGNOSIS — N390 Urinary tract infection, site not specified: Secondary | ICD-10-CM

## 2017-07-11 LAB — URINALYSIS, COMPLETE (UACMP) WITH MICROSCOPIC
Bilirubin Urine: NEGATIVE
Glucose, UA: NEGATIVE mg/dL
Hgb urine dipstick: NEGATIVE
KETONES UR: NEGATIVE mg/dL
NITRITE: POSITIVE — AB
PH: 6 (ref 5.0–8.0)
Protein, ur: NEGATIVE mg/dL
SPECIFIC GRAVITY, URINE: 1.025 (ref 1.005–1.030)

## 2017-07-11 MED ORDER — SULFAMETHOXAZOLE-TRIMETHOPRIM 800-160 MG PO TABS: 1.0000 | ORAL_TABLET | Freq: Two times a day (BID) | ORAL | 0 refills | Status: AC

## 2017-07-11 NOTE — ED Provider Notes (Signed)
[facility]    CSN: [id number] Arrival date & time: 07/11/17  1155     History   Chief Complaint Chief Complaint  Patient presents with  . Dysuria    HPI Yvette Rogers is a 71 y.o. female.   Patient is a 71 year old female who presents with complaint of UTI symptoms that started at the beginning of this week. Patient reports burning sensation with urination towards the middle and end of the stream. Patient also reports pressure, urgency, and frequency. She reports overall does being uncomfortable. She states she has been taking Azo for her symptoms. Patient denies fever and chills.      Past Medical History:  Diagnosis Date  . Anxiety     Patient Active Problem List   Diagnosis Date Noted  . Other insomnia 09/25/2016  . Anxiety 03/23/2015  . Body mass index 28.0-28.9, adult 03/23/2015  . Clinical depression 03/23/2015  . 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 [place] (subconjunctival hemorrhage) 03/23/2015  . Tenosynovitis 03/23/2015  . Variants of migraine 09/13/2009  . Adjustment disorder with mixed anxiety and depressed mood 06/11/2009    Past Surgical History:  Procedure Laterality Date  . BUNIONECTOMY Right   . CHOLECYSTECTOMY    . EYE SURGERY Bilateral    Cataract removed  . GALLBLADDER SURGERY    . SKIN CANCER EXCISION     face  . SKIN CANCER EXCISION Left 08/22/2013   left nostril area    OB History    No data available       Home Medications    Prior to Admission medications   Medication Sig Start Date End Date Taking? Authorizing Provider  buPROPion (WELLBUTRIN XL) 300 MG 24 hr tablet Take 1 tablet (300 mg total) by mouth daily. 03/26/17   Jerrol Banana., MD  ciprofloxacin (CIPRO) 250 MG tablet Take 1 tablet (250 mg total) by mouth 2 (two) times daily. 04/25/17   Carmon Ginsberg, PA  clonazePAM (KLONOPIN) 0.5 MG tablet Take 1 tablet (0.5 mg total) by mouth 2 (two) times daily as  needed for anxiety. 03/26/17   Jerrol Banana., MD  FLUVIRIN SUSP  07/10/16   [provider]  meloxicam (MOBIC) 15 MG tablet Take 1 tablet (15 mg total) by mouth daily as needed for pain. 03/26/17   Jerrol Banana., MD  promethazine (PHENERGAN) 25 MG tablet Take 1 tablet (25 mg total) by mouth every 8 (eight) hours as needed for nausea or vomiting. 12/06/15   Jerrol Banana., MD  sulfamethoxazole-trimethoprim (BACTRIM DS,SEPTRA DS) 800-160 MG tablet Take 1 tablet by mouth 2 (two) times daily. 04/03/17   Chrismon, Vickki Muff, PA  sulfamethoxazole-trimethoprim (BACTRIM DS,SEPTRA DS) 800-160 MG tablet Take 1 tablet by mouth 2 (two) times daily. 07/11/17 07/21/17  Luvenia Redden, PA-C  traZODone (DESYREL) 150 MG tablet Take 1 tablet (150 mg total) by mouth at bedtime. 03/26/17   Jerrol Banana., MD    Family History Family History  Problem Relation Age of Onset  . Cancer Mother        pancreatic  . Diabetes Mother   . Kidney Stones Mother   . COPD Father   . Diabetes Father   . Macular degeneration Father   . Breast cancer Sister 44  . Cancer Brother        Leukemia    Social History Social History  Substance Use Topics  . Smoking status: Never Smoker  .  Smokeless tobacco: Never Used  . Alcohol use No     Allergies   Penicillin v potassium and Penicillins   Review of Systems Review of Systems  As noted above in history of present illness. Other systems reviewed and found to be negative.   Physical Exam Triage Vital Signs ED Triage Vitals  Enc Vitals Group     BP 07/11/17 1229 127/74     Pulse Rate 07/11/17 1229 74     Resp 07/11/17 1229 16     Temp 07/11/17 1229 98 F (36.7 C)     Temp Source 07/11/17 1229 Oral     SpO2 07/11/17 1229 98 %     Weight 07/11/17 1227 175 lb (79.4 kg)     Height 07/11/17 1227 5\' 5"  (1.651 m)     Head Circumference --      Peak Flow --      Pain Score --      Pain Loc --      Pain Edu? --      Excl.  in [place]? --    No data found.   Updated Vital Signs BP 127/74 (BP Location: Right Arm)   Pulse 74   Temp 98 F (36.7 C) (Oral)   Resp 16   Ht 5\' 5"  (1.651 m)   Wt 175 lb (79.4 kg)   LMP  (LMP Unknown)   SpO2 98%   BMI 29.12 kg/m   Visual Acuity Right Eye Distance:   Left Eye Distance:   Bilateral Distance:    Right Eye Near:   Left Eye Near:    Bilateral Near:     Physical Exam  Constitutional: She is oriented to person, place, and time. She appears well-developed and well-nourished. No distress.  HENT:  Head: Normocephalic and atraumatic.  Eyes: EOM are normal.  Neck: Normal range of motion.  Cardiovascular: Normal rate.   Pulmonary/Chest: Breath sounds normal. No respiratory distress.  Abdominal:  Mild lower abdominal tenderness. Mild CVA tenderness bilaterally  Musculoskeletal: Normal range of motion. She exhibits no edema.  Neurological: She is alert and oriented to person, place, and time. No cranial nerve deficit.  Skin: Skin is warm and dry.     UC Treatments / Results  Labs (all labs ordered are listed, but only abnormal results are displayed) Labs Reviewed  URINALYSIS, COMPLETE (UACMP) WITH MICROSCOPIC - Abnormal; Notable for the following:       Result Value   APPearance CLOUDY (*)    Nitrite POSITIVE (*)    Leukocytes, UA SMALL (*)    Squamous Epithelial / LPF 0-5 (*)    Bacteria, UA MANY (*)    All other components within normal limits    EKG  EKG Interpretation None       Radiology No results found.  Procedures Procedures (including critical care time)  Medications Ordered in UC Medications - No data to display   Initial Impression / Assessment and Plan / UC Course  I have reviewed the triage vital signs and the nursing notes.  Pertinent labs & imaging results that were available during my care of the patient were reviewed by me and considered in my medical decision making (see chart for details).    Symptoms and UA concerning  for UTI  Final Clinical Impressions(s) / UC Diagnoses   Final diagnoses:  Urinary tract infection without hematuria, site unspecified   Will give patient prescription for Bactrim for 10 days for possible UTI with cystitis. Will send the  urine for culture.   New Prescriptions New Prescriptions   SULFAMETHOXAZOLE-TRIMETHOPRIM (BACTRIM DS,SEPTRA DS) 800-160 MG TABLET    Take 1 tablet by mouth 2 (two) times daily.     Controlled Substance Prescriptions [place] Controlled Substance Registry consulted? Not Applicable   Luvenia Redden, PA-C 07/11/17 1305

## 2017-07-11 NOTE — Discharge Instructions (Signed)
-  Bactrim: one tablet twice a day for 10 days -push fluids -can continue the Azo for pain relief -return to clinic if symptoms worsen or not improve

## 2017-07-11 NOTE — ED Triage Notes (Signed)
Pt with burning with urination all week. Having frequency and urgency. Taking AZO

## 2017-07-14 ENCOUNTER — Ambulatory Visit
Admission: EM | Admit: 2017-07-14 | Discharge: 2017-07-14 | Disposition: A | Discharge status: Home / Self Care | Payer: PPO | Attending: Family Medicine | Admitting: Family Medicine

## 2017-07-14 DIAGNOSIS — T50905A Adverse effect of unspecified drugs, medicaments and biological substances, initial encounter: Secondary | ICD-10-CM | POA: Diagnosis not present

## 2017-07-14 DIAGNOSIS — N390 Urinary tract infection, site not specified: Secondary | ICD-10-CM | POA: Diagnosis not present

## 2017-07-14 DIAGNOSIS — R220 Localized swelling, mass and lump, head: Secondary | ICD-10-CM

## 2017-07-14 LAB — URINE CULTURE: Culture: >=100000 — AB

## 2017-07-14 MED ORDER — CIPROFLOXACIN HCL 500 MG PO TABS: 500.0000 mg | ORAL_TABLET | Freq: Two times a day (BID) | ORAL | 0 refills | Status: DC

## 2017-07-14 NOTE — ED Triage Notes (Signed)
Pt reports starting Saturday with Bactrim for UTI. This morning noticed some mild tongue swelling which has not worsened throughout the day but also has not improved. No wheezing, no rash, no SOB. Tonight she noticed she is developing a mild cough and mild sore throat.

## 2017-07-14 NOTE — Discharge Instructions (Signed)
Increased water intake Benadryl as needed

## 2017-07-14 NOTE — ED Provider Notes (Signed)
[facility]    CSN: [id number] Arrival date & time: 07/14/17  1940     History   Chief Complaint Chief Complaint  Patient presents with  . Allergic Reaction    HPI Yvette Rogers is a 71 y.o. female.   71 yo female with a c/o sensation of her tongue being swollen since this morning. Patient has been taking Bactrim for 3 days for a UTI with the last dose being early this morning. Later in the morning she felt her tongue was swollen and has taken several doses of Benadryl. Denies any shortness of breath, wheezing, rash, difficulty swallowing. States UTI symptoms are better.    The history is provided by the patient.  Allergic Reaction    Past Medical History:  Diagnosis Date  . Anxiety     Patient Active Problem List   Diagnosis Date Noted  . Other insomnia 09/25/2016  . Anxiety 03/23/2015  . Body mass index 28.0-28.9, adult 03/23/2015  . Clinical depression 03/23/2015  . 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 [place] (subconjunctival hemorrhage) 03/23/2015  . Tenosynovitis 03/23/2015  . Variants of migraine 09/13/2009  . Adjustment disorder with mixed anxiety and depressed mood 06/11/2009    Past Surgical History:  Procedure Laterality Date  . BUNIONECTOMY Right   . CHOLECYSTECTOMY    . EYE SURGERY Bilateral    Cataract removed  . GALLBLADDER SURGERY    . SKIN CANCER EXCISION     face  . SKIN CANCER EXCISION Left 08/22/2013   left nostril area    OB History    No data available       Home Medications    Prior to Admission medications   Medication Sig Start Date End Date Taking? Authorizing Provider  buPROPion (WELLBUTRIN XL) 300 MG 24 hr tablet Take 1 tablet (300 mg total) by mouth daily. 03/26/17   Jerrol Banana., MD  ciprofloxacin (CIPRO) 500 MG tablet Take 1 tablet (500 mg total) by mouth every 12 (twelve) hours. 07/14/17   Norval Gable, MD  clonazePAM (KLONOPIN) 0.5 MG tablet Take 1  tablet (0.5 mg total) by mouth 2 (two) times daily as needed for anxiety. 03/26/17   Jerrol Banana., MD  FLUVIRIN SUSP  07/10/16   [provider]  meloxicam (MOBIC) 15 MG tablet Take 1 tablet (15 mg total) by mouth daily as needed for pain. 03/26/17   Jerrol Banana., MD  promethazine (PHENERGAN) 25 MG tablet Take 1 tablet (25 mg total) by mouth every 8 (eight) hours as needed for nausea or vomiting. 12/06/15   Jerrol Banana., MD  sulfamethoxazole-trimethoprim (BACTRIM DS,SEPTRA DS) 800-160 MG tablet Take 1 tablet by mouth 2 (two) times daily. 04/03/17   Chrismon, Vickki Muff, PA  sulfamethoxazole-trimethoprim (BACTRIM DS,SEPTRA DS) 800-160 MG tablet Take 1 tablet by mouth 2 (two) times daily. 07/11/17 07/21/17  Luvenia Redden, PA-C  traZODone (DESYREL) 150 MG tablet Take 1 tablet (150 mg total) by mouth at bedtime. 03/26/17   Jerrol Banana., MD    Family History Family History  Problem Relation Age of Onset  . Cancer Mother        pancreatic  . Diabetes Mother   . Kidney Stones Mother   . COPD Father   . Diabetes Father   . Macular degeneration Father   . Breast cancer Sister 8  . Cancer Brother        Leukemia  Social History Social History  Substance Use Topics  . Smoking status: Never Smoker  . Smokeless tobacco: Never Used  . Alcohol use No     Allergies   Bactrim [sulfamethoxazole-trimethoprim]; Penicillin v potassium; and Penicillins   Review of Systems Review of Systems   Physical Exam Triage Vital Signs ED Triage Vitals  Enc Vitals Group     BP 07/14/17 1951 119/74     Pulse Rate 07/14/17 1951 93     Resp 07/14/17 1951 18     Temp 07/14/17 1951 (!) 97.5 F (36.4 C)     Temp Source 07/14/17 1951 Oral     SpO2 07/14/17 1951 96 %     Weight 07/14/17 1949 175 lb (79.4 kg)     Height 07/14/17 1949 5\' 5"  (1.651 m)     Head Circumference --      Peak Flow --      Pain Score 07/14/17 1949 3     Pain Loc --      Pain Edu?  --      Excl. in [place]? --    No data found.   Updated Vital Signs BP 119/74 (BP Location: Left Arm)   Pulse 93   Temp (!) 97.5 F (36.4 C) (Oral)   Resp 18   Ht 5\' 5"  (1.651 m)   Wt 175 lb (79.4 kg)   LMP  (LMP Unknown)   SpO2 96%   BMI 29.12 kg/m   Visual Acuity Right Eye Distance:   Left Eye Distance:   Bilateral Distance:    Right Eye Near:   Left Eye Near:    Bilateral Near:     Physical Exam  Constitutional: She appears well-developed and well-nourished. No distress.  HENT:  Mouth/Throat: Uvula is midline and oropharynx is clear and moist. No oropharyngeal exudate, posterior oropharyngeal edema, posterior oropharyngeal erythema or tonsillar abscesses.  Neck: Normal range of motion. Neck supple. No tracheal deviation present.  Cardiovascular: Normal rate, regular rhythm and normal heart sounds.   Pulmonary/Chest: Effort normal and breath sounds normal. No stridor. No respiratory distress. She has no wheezes. She has no rales.  Skin: She is not diaphoretic.  Nursing note and vitals reviewed.    UC Treatments / Results  Labs (all labs ordered are listed, but only abnormal results are displayed) Labs Reviewed - No data to display  EKG  EKG Interpretation None       Radiology No results found.  Procedures Procedures (including critical care time)  Medications Ordered in UC Medications - No data to display   Initial Impression / Assessment and Plan / UC Course  I have reviewed the triage vital signs and the nursing notes.  Pertinent labs & imaging results that were available during my care of the patient were reviewed by me and considered in my medical decision making (see chart for details).       Final Clinical Impressions(s) / UC Diagnoses   Final diagnoses:  Adverse effect of drug, initial encounter  Urinary tract infection without hematuria, site unspecified    New Prescriptions Discharge Medication List as of 07/14/2017  8:14 PM      1. diagnosis reviewed with patient 2. Recommend D/C Bactrim due to possible adverse reaction 3. rx as per orders above; reviewed possible side effects, interactions, risks and benefits; rx for cipro x 3 days  4. Recommend supportive treatment with increased fluids, benadryl prn 4. Follow-up prn if symptoms worsen or don't improve  Controlled Substance Prescriptions  [place] Controlled Substance Registry consulted? Not Applicable   Norval Gable, MD 07/14/17 2045

## 2017-07-30 ENCOUNTER — Ambulatory Visit: Payer: PPO | Admitting: Family Medicine

## 2017-08-18 ENCOUNTER — Ambulatory Visit: Payer: PPO | Admitting: Family Medicine

## 2017-08-18 ENCOUNTER — Encounter: Payer: Self-pay | Admitting: Family Medicine

## 2017-08-18 ENCOUNTER — Other Ambulatory Visit: Payer: Self-pay

## 2017-08-18 VITALS — BP 146/78 | HR 80 | Temp 97.6°F | Resp 14

## 2017-08-18 DIAGNOSIS — F419 Anxiety disorder, unspecified: Secondary | ICD-10-CM | POA: Diagnosis not present

## 2017-08-18 DIAGNOSIS — G4709 Other insomnia: Secondary | ICD-10-CM | POA: Diagnosis not present

## 2017-08-18 MED ORDER — TRAZODONE HCL 150 MG PO TABS: 150.0000 mg | ORAL_TABLET | Freq: Every day | ORAL | 3 refills | Status: DC

## 2017-08-18 MED ORDER — CLONAZEPAM 0.5 MG PO TABS: 0.5000 mg | ORAL_TABLET | Freq: Two times a day (BID) | ORAL | 5 refills | Status: DC | PRN

## 2017-08-18 NOTE — Patient Instructions (Signed)
Try Melatonin 10 mg at night for sleep.

## 2017-08-18 NOTE — Progress Notes (Signed)
Yvette Rogers  MRN: [id number] DOB: [birth year]  Subjective:  HPI   The patient is a 71 year old female who presents for follow up of her depression.  She states she is under increased stress between her husband, 2 siblings with cancer and her grandson recently had a second kidney transplant.  She is not sleeping well at night despite the Trazodone.  She needs refills on her Clonazepam and Trazodone.  She thought she was only on 50 mg and wanted to know if we could increase her dose.  She believes she is only on 61 and will check her bottle when she gets home.   Patient Active Problem List   Diagnosis Date Noted  . Other insomnia 09/25/2016  . Anxiety 03/23/2015  . Body mass index 28.0-28.9, adult 03/23/2015  . Clinical depression 03/23/2015  . 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 [place] (subconjunctival hemorrhage) 03/23/2015  . Tenosynovitis 03/23/2015  . Variants of migraine 09/13/2009  . Adjustment disorder with mixed anxiety and depressed mood 06/11/2009    Past Medical History:  Diagnosis Date  . Anxiety     Social History   Socioeconomic History  . Marital status: Married    Spouse name: Not on file  . Number of children: Not on file  . Years of education: Not on file  . Highest education level: Not on file  Social Needs  . Financial resource strain: Not on file  . Food insecurity - worry: Not on file  . Food insecurity - inability: Not on file  . Transportation needs - medical: Not on file  . Transportation needs - non-medical: Not on file  Occupational History  . Not on file  Tobacco Use  . Smoking status: Never Smoker  . Smokeless tobacco: Never Used  Substance and Sexual Activity  . Alcohol use: No  . Drug use: No  . Sexual activity: Not on file  Other Topics Concern  . Not on file  Social History Narrative  . Not on file    Outpatient Encounter Medications as of 08/18/2017  Medication Sig Note  . buPROPion  (WELLBUTRIN XL) 300 MG 24 hr tablet Take 1 tablet (300 mg total) by mouth daily.   . clonazePAM (KLONOPIN) 0.5 MG tablet Take 1 tablet (0.5 mg total) by mouth 2 (two) times daily as needed for anxiety.   Marland Kitchen FLUVIRIN SUSP    . meloxicam (MOBIC) 15 MG tablet Take 1 tablet (15 mg total) by mouth daily as needed for pain.   . traZODone (DESYREL) 150 MG tablet Take 1 tablet (150 mg total) by mouth at bedtime.   . [DISCONTINUED] ciprofloxacin (CIPRO) 500 MG tablet Take 1 tablet (500 mg total) by mouth every 12 (twelve) hours.   . [DISCONTINUED] promethazine (PHENERGAN) 25 MG tablet Take 1 tablet (25 mg total) by mouth every 8 (eight) hours as needed for nausea or vomiting. 09/25/2016: Out of the RX  . [DISCONTINUED] sulfamethoxazole-trimethoprim (BACTRIM DS,SEPTRA DS) 800-160 MG tablet Take 1 tablet by mouth 2 (two) times daily.    No facility-administered encounter medications on file as of 08/18/2017.     Allergies  Allergen Reactions  . Bactrim [Sulfamethoxazole-Trimethoprim] Swelling  . Penicillin V Potassium Hives  . Penicillins Hives    Review of Systems  Constitutional: Negative for fever and malaise/fatigue.  Eyes: Negative.   Respiratory: Negative for cough, shortness of breath and wheezing.   Cardiovascular: Negative for chest pain, palpitations, orthopnea, claudication and leg swelling.  Gastrointestinal: Negative.   Skin: Negative.   Neurological: Negative for weakness.  Endo/Heme/Allergies: Negative.   Psychiatric/Behavioral: Negative for depression, hallucinations, memory loss, substance abuse and suicidal ideas. The patient has insomnia. The patient is not nervous/anxious.     Objective:  BP (!) 146/78 (BP Location: Right Arm, Patient Position: Sitting, Cuff Size: Normal)   Pulse 80   Temp 97.6 F (36.4 C) (Oral)   Resp 14   LMP  (LMP Unknown)   Physical Exam  Constitutional: She is oriented to person, place, and time and well-developed, well-nourished, and in no  distress.  HENT:  Head: Normocephalic and atraumatic.  Eyes: Conjunctivae are normal. Pupils are equal, round, and reactive to light.  Neck: Normal range of motion.  Cardiovascular: Normal rate, regular rhythm and normal heart sounds.  Pulmonary/Chest: Effort normal and breath sounds normal.  Abdominal: Soft.  Musculoskeletal: She exhibits no edema.  Neurological: She is alert and oriented to person, place, and time. Gait normal. GCS score is 15.  Skin: Skin is warm and dry.  Psychiatric: Mood, memory, affect and judgment normal.    Assessment and Plan :  Reactive Depression/anxiety Relatively stable.  I have done the exam and reviewed the chart and it is accurate to the best of my knowledge. Development worker, community has been used and  any errors in dictation or transcription are unintentional. Miguel Aschoff M.D. [facility]

## 2017-08-22 ENCOUNTER — Ambulatory Visit: Payer: PPO | Admitting: Family Medicine

## 2017-09-12 ENCOUNTER — Other Ambulatory Visit: Payer: Self-pay | Admitting: Family Medicine

## 2017-09-12 DIAGNOSIS — M25571 Pain in right ankle and joints of right foot: Secondary | ICD-10-CM

## 2017-09-12 DIAGNOSIS — M25572 Pain in left ankle and joints of left foot: Principal | ICD-10-CM

## 2017-10-01 ENCOUNTER — Ambulatory Visit: Payer: Self-pay

## 2017-10-05 ENCOUNTER — Encounter: Payer: PPO | Admitting: Family Medicine

## 2017-10-20 ENCOUNTER — Other Ambulatory Visit: Payer: Self-pay | Admitting: Family Medicine

## 2017-10-20 DIAGNOSIS — Z1231 Encounter for screening mammogram for malignant neoplasm of breast: Secondary | ICD-10-CM

## 2017-10-21 ENCOUNTER — Ambulatory Visit
Admission: RE | Admit: 2017-10-21 | Discharge: 2017-10-21 | Disposition: A | Discharge status: Home / Self Care | Payer: PPO | Source: Ambulatory Visit | Attending: Family Medicine | Admitting: Family Medicine

## 2017-10-21 ENCOUNTER — Encounter (INDEPENDENT_AMBULATORY_CARE_PROVIDER_SITE_OTHER): Payer: Self-pay

## 2017-10-21 DIAGNOSIS — Z1231 Encounter for screening mammogram for malignant neoplasm of breast: Secondary | ICD-10-CM | POA: Diagnosis not present

## 2017-10-21 HISTORY — DX: Malignant (primary) neoplasm, unspecified: C80.1

## 2017-10-26 ENCOUNTER — Telehealth: Payer: Self-pay | Admitting: Family Medicine

## 2017-11-06 NOTE — Telephone Encounter (Signed)
Appt scheduled

## 2017-11-17 ENCOUNTER — Ambulatory Visit (INDEPENDENT_AMBULATORY_CARE_PROVIDER_SITE_OTHER): Payer: PPO | Admitting: Family Medicine

## 2017-11-17 ENCOUNTER — Encounter: Payer: Self-pay | Admitting: Family Medicine

## 2017-11-17 VITALS — BP 136/80 | HR 80 | Temp 98.4°F | Resp 16

## 2017-11-17 DIAGNOSIS — F3289 Other specified depressive episodes: Secondary | ICD-10-CM

## 2017-11-17 DIAGNOSIS — F419 Anxiety disorder, unspecified: Secondary | ICD-10-CM | POA: Diagnosis not present

## 2017-11-17 NOTE — Progress Notes (Signed)
       Patient: Yvette Rogers Female    DOB: 12/19/1945   72 y.o.   MRN: [id number] Visit Date: 11/17/2017  Today's Provider: [name], MD   Chief Complaint  Patient presents with  . Anxiety  . Depression   Subjective:    HPI Pt is here for a 3 month follow up of her chronic problems. She reports that she is doing well and feeling well. No complaints or concerns.     Allergies  Allergen Reactions  . Bactrim [Sulfamethoxazole-Trimethoprim] Swelling  . Penicillin V Potassium Hives  . Penicillins Hives     Current Outpatient Medications:  .  buPROPion (WELLBUTRIN XL) 300 MG 24 hr tablet, Take 1 tablet (300 mg total) by mouth daily., Disp: 90 tablet, Rfl: 3 .  clonazePAM (KLONOPIN) 0.5 MG tablet, Take 1 tablet (0.5 mg total) by mouth 2 (two) times daily as needed for anxiety., Disp: 60 tablet, Rfl: 5 .  meloxicam (MOBIC) 15 MG tablet, TAKE 1 TABLET BY MOUTH ONCE DAILY IF NEEDED FOR PAIN, Disp: 90 tablet, Rfl: 0 .  traZODone (DESYREL) 150 MG tablet, Take 1 tablet (150 mg total) by mouth at bedtime., Disp: 90 tablet, Rfl: 3  Review of Systems  Constitutional: Negative.   HENT: Negative.   Eyes: Negative.   Respiratory: Negative.   Cardiovascular: Negative.   Gastrointestinal: Negative.   Endocrine: Negative.   Genitourinary: Negative.   Musculoskeletal: Negative.   Skin: Negative.   Allergic/Immunologic: Negative.   Neurological: Negative.   Hematological: Negative.   Psychiatric/Behavioral: Negative.     Social History   Tobacco Use  . Smoking status: Never Smoker  . Smokeless tobacco: Never Used  Substance Use Topics  . Alcohol use: Yes    Comment: occasionally   Objective:   BP 136/80 (BP Location: Right Arm, Patient Position: Sitting, Cuff Size: Normal)   Pulse 80   Temp 98.4 F (36.9 C) (Oral)   Resp 16   LMP  (LMP Unknown)  Vitals:   11/17/17 1628  BP: 136/80  Pulse: 80  Resp: 16  Temp: 98.4 F (36.9 C)  TempSrc: Oral     Physical  Exam  Constitutional: She is oriented to person, place, and time. She appears well-developed and well-nourished.  HENT:  Head: Normocephalic and atraumatic.  Eyes: Conjunctivae are normal.  Neck: No thyromegaly present.  Cardiovascular: Normal rate, regular rhythm and normal heart sounds.  Pulmonary/Chest: Effort normal and breath sounds normal.  Abdominal: Soft.  Neurological: She is alert and oriented to person, place, and time.  Skin: Skin is warm and dry.  Psychiatric: She has a normal mood and affect. Her behavior is normal. Judgment and thought content normal.        Assessment & Plan:     MDD/GAD Stable--encouraged pt to try to lessen benzo use. RTC 6 months--CPE per gyn per pt.      I have done the exam and reviewed the above chart and it is accurate to the best of my knowledge. Development worker, community has been used in this note in any air is in the dictation or transcription are unintentional.  [name], MD  [place]

## 2017-12-11 ENCOUNTER — Ambulatory Visit: Payer: Self-pay

## 2017-12-18 ENCOUNTER — Ambulatory Visit: Payer: Self-pay

## 2017-12-25 ENCOUNTER — Ambulatory Visit: Payer: Self-pay

## 2018-01-10 ENCOUNTER — Other Ambulatory Visit: Payer: Self-pay

## 2018-01-10 ENCOUNTER — Ambulatory Visit
Admission: EM | Admit: 2018-01-10 | Discharge: 2018-01-10 | Disposition: A | Discharge status: Home / Self Care | Payer: PPO | Attending: Family Medicine | Admitting: Family Medicine

## 2018-01-10 DIAGNOSIS — R350 Frequency of micturition: Secondary | ICD-10-CM | POA: Diagnosis not present

## 2018-01-10 DIAGNOSIS — N898 Other specified noninflammatory disorders of vagina: Secondary | ICD-10-CM

## 2018-01-10 DIAGNOSIS — R3915 Urgency of urination: Secondary | ICD-10-CM | POA: Diagnosis not present

## 2018-01-10 DIAGNOSIS — R300 Dysuria: Secondary | ICD-10-CM | POA: Diagnosis not present

## 2018-01-10 LAB — URINALYSIS, COMPLETE (UACMP) WITH MICROSCOPIC
Bacteria, UA: NONE SEEN
Bilirubin Urine: NEGATIVE
Glucose, UA: NEGATIVE mg/dL
HGB URINE DIPSTICK: NEGATIVE
Leukocytes, UA: NEGATIVE
Nitrite: NEGATIVE
PROTEIN: NEGATIVE mg/dL
RBC / HPF: NONE SEEN RBC/hpf (ref 0–5)
SPECIFIC GRAVITY, URINE: 1.02 (ref 1.005–1.030)
WBC UA: NONE SEEN WBC/hpf (ref 0–5)
pH: 5.5 (ref 5.0–8.0)

## 2018-01-10 MED ORDER — PHENAZOPYRIDINE HCL 200 MG PO TABS: 200.0000 mg | ORAL_TABLET | Freq: Three times a day (TID) | ORAL | 0 refills | Status: DC | PRN

## 2018-01-10 NOTE — ED Provider Notes (Signed)
and off urgency [facility]    CSN: [id number] Arrival date & time: 01/10/18  1314     History   Chief Complaint Chief Complaint  Patient presents with  . Dysuria    HPI Yvette Rogers is a 72 y.o. female Presents to the urgent care facility for evaluation of possible UTI.  Patient complains of 3 weeks of dysuria, urgency, burning with urination.  She denies any back pain, abdominal pain but does feel some pelvic pressure.  No vaginal discharge or bleeding.  She denies any nausea vomiting or fevers.  She is tolerating p.o. well.  Last urinary tract infection was in October, was treated with Bactrim, which caused patient to develop allergic reaction.  Urine culture showed Klebsiella pneumonia with sensitivity to Cipro, patient tolerated Cipro well.    HPI  Past Medical History:  Diagnosis Date  . Anxiety   . Cancer [facility])    skin ca    Patient Active Problem List   Diagnosis Date Noted  . Other insomnia 09/25/2016  . Anxiety 03/23/2015  . Body mass index 28.0-28.9, adult 03/23/2015  . Clinical depression 03/23/2015  . 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 [place] (subconjunctival hemorrhage) 03/23/2015  . Tenosynovitis 03/23/2015  . Variants of migraine 09/13/2009  . Adjustment disorder with mixed anxiety and depressed mood 06/11/2009    Past Surgical History:  Procedure Laterality Date  . BUNIONECTOMY Right   . CHOLECYSTECTOMY    . EYE SURGERY Bilateral    Cataract removed  . GALLBLADDER SURGERY    . SKIN CANCER EXCISION     face  . SKIN CANCER EXCISION Left 08/22/2013   left nostril area    OB History   None      Home Medications    Prior to Admission medications   Medication Sig Start Date End Date Taking? Authorizing Provider  buPROPion (WELLBUTRIN XL) 300 MG 24 hr tablet Take 1 tablet (300 mg total) by mouth daily. 03/26/17   Jerrol Banana., MD  clonazePAM (KLONOPIN) 0.5 MG tablet Take 1  tablet (0.5 mg total) by mouth 2 (two) times daily as needed for anxiety. 08/18/17   Jerrol Banana., MD  meloxicam (MOBIC) 15 MG tablet TAKE 1 TABLET BY MOUTH ONCE DAILY IF NEEDED FOR PAIN 09/14/17   Jerrol Banana., MD  phenazopyridine (PYRIDIUM) 200 MG tablet Take 1 tablet (200 mg total) by mouth 3 (three) times daily as needed for pain. 01/10/18   Duanne Guess, PA-C  traZODone (DESYREL) 150 MG tablet Take 1 tablet (150 mg total) by mouth at bedtime. 08/18/17   Jerrol Banana., MD    Family History Family History  Problem Relation Age of Onset  . Cancer Mother        pancreatic  . Diabetes Mother   . Kidney Stones Mother   . COPD Father   . Diabetes Father   . Macular degeneration Father   . Breast cancer Sister 86  . Cancer Brother        Leukemia  . Breast cancer Cousin        mat cousin    Social History Social History   Tobacco Use  . Smoking status: Never Smoker  . Smokeless tobacco: Never Used  Substance Use Topics  . Alcohol use: Yes    Comment: occasionally  . Drug use: No     Allergies   Bactrim [sulfamethoxazole-trimethoprim]; Penicillin v potassium; and Penicillins  Review of Systems Review of Systems  Constitutional: Negative for fever.  Respiratory: Negative for shortness of breath.   Cardiovascular: Negative for chest pain.  Gastrointestinal: Negative for abdominal pain.  Genitourinary: Positive for dysuria, frequency, urgency and vaginal discharge. Negative for difficulty urinating, flank pain and vaginal pain.  Musculoskeletal: Negative for back pain and myalgias.  Skin: Negative for rash.  Neurological: Negative for dizziness and headaches.     Physical Exam Triage Vital Signs ED Triage Vitals  Enc Vitals Group     BP 01/10/18 1323 138/81     Pulse Rate 01/10/18 1323 69     Resp 01/10/18 1323 16     Temp 01/10/18 1323 97.9 F (36.6 C)     Temp Source 01/10/18 1323 Oral     SpO2 01/10/18 1323 100 %     Weight  01/10/18 1321 170 lb (77.1 kg)     Height 01/10/18 1321 5\' 5"  (1.651 m)     Head Circumference --      Peak Flow --      Pain Score 01/10/18 1321 2     Pain Loc --      Pain Edu? --      Excl. in [place]? --    No data found.  Updated Vital Signs BP 138/81 (BP Location: Right Arm)   Pulse 69   Temp 97.9 F (36.6 C) (Oral)   Resp 16   Ht 5\' 5"  (1.651 m)   Wt 170 lb (77.1 kg)   LMP  (LMP Unknown)   SpO2 100%   BMI 28.29 kg/m   Visual Acuity Right Eye Distance:   Left Eye Distance:   Bilateral Distance:    Right Eye Near:   Left Eye Near:    Bilateral Near:     Physical Exam  Constitutional: She is oriented to person, place, and time. She appears well-developed and well-nourished.  HENT:  Head: Normocephalic and atraumatic.  Eyes: Conjunctivae are normal.  Neck: Normal range of motion.  Cardiovascular: Normal rate.  Pulmonary/Chest: Effort normal. No respiratory distress.  Abdominal: Soft. She exhibits no distension. There is no tenderness.  No CVA tenderness.  Musculoskeletal: Normal range of motion.  Neurological: She is alert and oriented to person, place, and time.  Skin: Skin is warm. No rash noted.  Psychiatric: She has a normal mood and affect. Her behavior is normal. Thought content normal.     UC Treatments / Results  Labs (all labs ordered are listed, but only abnormal results are displayed) Labs Reviewed  URINALYSIS, COMPLETE (UACMP) WITH MICROSCOPIC - Abnormal; Notable for the following components:      Result Value   Ketones, ur TRACE (*)    All other components within normal limits  URINE CULTURE    EKG None Radiology No results found.  Procedures Procedures (including critical care time)  Medications Ordered in UC Medications - No data to display   Initial Impression / Assessment and Plan / UC Course  I have reviewed the triage vital signs and the nursing notes.  Pertinent labs & imaging results that were available during my care of  the patient were reviewed by me and considered in my medical decision making (see chart for details).     72 year old female with dysuria on and off for 3 weeks.  She is afebrile with no nausea or vomiting.  No back pain.  Urinalysis ordered and reviewed by me today shows no sign of UTI.  Urinalysis was normal.  She  is placed on Pyridium for discomfort.  She will increase her fluids.  Urine culture was obtained.  Patient is educated on signs symptoms return to clinic for.  Final Clinical Impressions(s) / UC Diagnoses   Final diagnoses:  Dysuria    ED Discharge Orders        Ordered    phenazopyridine (PYRIDIUM) 200 MG tablet  3 times daily PRN,   Status:  Discontinued     01/10/18 1353    phenazopyridine (PYRIDIUM) 200 MG tablet  3 times daily PRN     01/10/18 1354        Duanne Guess, PA-C 01/10/18 1355

## 2018-01-10 NOTE — ED Triage Notes (Signed)
3 weeks of dysuria and urgency off and on.

## 2018-01-10 NOTE — Discharge Instructions (Addendum)
Please drink lots of fluids.  Take Pyridium as prescribed.  Return to the clinic for any fevers, increasing pain, worsening symptoms or urgent changes in health.  Urine culture was obtained today and if positive for bacterial infection you will get a call within 48 hours letting you know that antibiotics are needed and were sent in.

## 2018-01-12 LAB — URINE CULTURE

## 2018-01-12 NOTE — Progress Notes (Signed)
Culture does not suggest a uti.

## 2018-01-13 DIAGNOSIS — M62830 Muscle spasm of back: Secondary | ICD-10-CM | POA: Diagnosis not present

## 2018-01-13 DIAGNOSIS — R293 Abnormal posture: Secondary | ICD-10-CM | POA: Diagnosis not present

## 2018-01-13 DIAGNOSIS — M9902 Segmental and somatic dysfunction of thoracic region: Secondary | ICD-10-CM | POA: Diagnosis not present

## 2018-01-13 DIAGNOSIS — M62411 Contracture of muscle, right shoulder: Secondary | ICD-10-CM | POA: Diagnosis not present

## 2018-01-13 DIAGNOSIS — M9901 Segmental and somatic dysfunction of cervical region: Secondary | ICD-10-CM | POA: Diagnosis not present

## 2018-01-14 ENCOUNTER — Ambulatory Visit (INDEPENDENT_AMBULATORY_CARE_PROVIDER_SITE_OTHER): Payer: PPO

## 2018-01-14 VITALS — BP 130/84 | HR 80 | Temp 98.1°F | Ht 66.0 in

## 2018-01-14 DIAGNOSIS — M9901 Segmental and somatic dysfunction of cervical region: Secondary | ICD-10-CM | POA: Diagnosis not present

## 2018-01-14 DIAGNOSIS — M9902 Segmental and somatic dysfunction of thoracic region: Secondary | ICD-10-CM | POA: Diagnosis not present

## 2018-01-14 DIAGNOSIS — Z0000 Encounter for general adult medical examination without abnormal findings: Secondary | ICD-10-CM | POA: Diagnosis not present

## 2018-01-14 DIAGNOSIS — Z13820 Encounter for screening for osteoporosis: Secondary | ICD-10-CM

## 2018-01-14 DIAGNOSIS — M62830 Muscle spasm of back: Secondary | ICD-10-CM | POA: Diagnosis not present

## 2018-01-14 DIAGNOSIS — R293 Abnormal posture: Secondary | ICD-10-CM | POA: Diagnosis not present

## 2018-01-14 DIAGNOSIS — M62411 Contracture of muscle, right shoulder: Secondary | ICD-10-CM | POA: Diagnosis not present

## 2018-01-14 NOTE — Patient Instructions (Signed)
Yvette Rogers , Thank you for taking time to come for your Medicare Wellness Visit. I appreciate your ongoing commitment to your health goals. Please review the following plan we discussed and let me know if I can assist you in the future.   Screening recommendations/referrals: Colonoscopy: Up to date Mammogram: Up to date Bone Density: Referral sent today. Recommended yearly ophthalmology/optometry visit for glaucoma screening and checkup Recommended yearly dental visit for hygiene and checkup  Vaccinations: Influenza vaccine: N/A Pneumococcal vaccine: Up to date Tdap vaccine: Up to date Shingles vaccine: Pt declines today.     Advanced directives: Advance directive discussed with you today. I have provided a copy for you to complete at home and have notarized. Once this is complete please bring a copy in to our office so we can scan it into your chart.  Conditions/risks identified: Recommend to start exercising 3 days a week for at least 30 minutes at a time.  Next appointment: follow up on 05/20/18 with Dr Rosanna Randy (declined CPE).   Preventive Care 39 Years and Older, Female Preventive care refers to lifestyle choices and visits with your health care provider that can promote health and wellness. What does preventive care include?  A yearly physical exam. This is also called an annual well check.  Dental exams once or twice a year.  Routine eye exams. Ask your health care provider how often you should have your eyes checked.  Personal lifestyle choices, including:  Daily care of your teeth and gums.  Regular physical activity.  Eating a healthy diet.  Avoiding tobacco and drug use.  Limiting alcohol use.  Practicing safe sex.  Taking low-dose aspirin every day.  Taking vitamin and mineral supplements as recommended by your health care provider. What happens during an annual well check? The services and screenings done by your health care provider during your annual  well check will depend on your age, overall health, lifestyle risk factors, and family history of disease. Counseling  Your health care provider may ask you questions about your:  Alcohol use.  Tobacco use.  Drug use.  Emotional well-being.  Home and relationship well-being.  Sexual activity.  Eating habits.  History of falls.  Memory and ability to understand (cognition).  Work and work Statistician.  Reproductive health. Screening  You may have the following tests or measurements:  Height, weight, and BMI.  Blood pressure.  Lipid and cholesterol levels. These may be checked every 5 years, or more frequently if you are over 79 years old.  Skin check.  Lung cancer screening. You may have this screening every year starting at age 98 if you have a 30-pack-year history of smoking and currently smoke or have quit within the past 15 years.  Fecal occult blood test (FOBT) of the stool. You may have this test every year starting at age 19.  Flexible sigmoidoscopy or colonoscopy. You may have a sigmoidoscopy every 5 years or a colonoscopy every 10 years starting at age 22.  Hepatitis C blood test.  Hepatitis B blood test.  Sexually transmitted disease (STD) testing.  Diabetes screening. This is done by checking your blood sugar (glucose) after you have not eaten for a while (fasting). You may have this done every 1-3 years.  Bone density scan. This is done to screen for osteoporosis. You may have this done starting at age 3.  Mammogram. This may be done every 1-2 years. Talk to your health care provider about how often you should have regular mammograms.  Talk with your health care provider about your test results, treatment options, and if necessary, the need for more tests. Vaccines  Your health care provider may recommend certain vaccines, such as:  Influenza vaccine. This is recommended every year.  Tetanus, diphtheria, and acellular pertussis (Tdap, Td) vaccine.  You may need a Td booster every 10 years.  Zoster vaccine. You may need this after age 83.  Pneumococcal 13-valent conjugate (PCV13) vaccine. One dose is recommended after age 42.  Pneumococcal polysaccharide (PPSV23) vaccine. One dose is recommended after age 66. Talk to your health care provider about which screenings and vaccines you need and how often you need them. This information is not intended to replace advice given to you by your health care provider. Make sure you discuss any questions you have with your health care provider. Document Released: 09/28/2015 Document Revised: 05/21/2016 Document Reviewed: 07/03/2015 Elsevier Interactive Patient Education  2017 [place] Prevention in the Home Falls can cause injuries. They can happen to people of all ages. There are many things you can do to make your home safe and to help prevent falls. What can I do on the outside of my home?  Regularly fix the edges of walkways and driveways and fix any cracks.  Remove anything that might make you trip as you walk through a door, such as a raised step or threshold.  Trim any bushes or trees on the path to your home.  Use bright outdoor lighting.  Clear any walking paths of anything that might make someone trip, such as rocks or tools.  Regularly check to see if handrails are loose or broken. Make sure that both sides of any steps have handrails.  Any raised decks and porches should have guardrails on the edges.  Have any leaves, snow, or ice cleared regularly.  Use sand or salt on walking paths during winter.  Clean up any spills in your garage right away. This includes oil or grease spills. What can I do in the bathroom?  Use night lights.  Install grab bars by the toilet and in the tub and shower. Do not use towel bars as grab bars.  Use non-skid mats or decals in the tub or shower.  If you need to sit down in the shower, use a plastic, non-slip stool.  Keep the  floor dry. Clean up any water that spills on the floor as soon as it happens.  Remove soap buildup in the tub or shower regularly.  Attach bath mats securely with double-sided non-slip rug tape.  Do not have throw rugs and other things on the floor that can make you trip. What can I do in the bedroom?  Use night lights.  Make sure that you have a light by your bed that is easy to reach.  Do not use any sheets or blankets that are too big for your bed. They should not hang down onto the floor.  Have a firm chair that has side arms. You can use this for support while you get dressed.  Do not have throw rugs and other things on the floor that can make you trip. What can I do in the kitchen?  Clean up any spills right away.  Avoid walking on wet floors.  Keep items that you use a lot in easy-to-reach places.  If you need to reach something above you, use a strong step stool that has a grab bar.  Keep electrical cords out of the way.  Do not use floor polish or wax that makes floors slippery. If you must use wax, use non-skid floor wax.  Do not have throw rugs and other things on the floor that can make you trip. What can I do with my stairs?  Do not leave any items on the stairs.  Make sure that there are handrails on both sides of the stairs and use them. Fix handrails that are broken or loose. Make sure that handrails are as long as the stairways.  Check any carpeting to make sure that it is firmly attached to the stairs. Fix any carpet that is loose or worn.  Avoid having throw rugs at the top or bottom of the stairs. If you do have throw rugs, attach them to the floor with carpet tape.  Make sure that you have a light switch at the top of the stairs and the bottom of the stairs. If you do not have them, ask someone to add them for you. What else can I do to help prevent falls?  Wear shoes that:  Do not have high heels.  Have rubber bottoms.  Are comfortable and fit  you well.  Are closed at the toe. Do not wear sandals.  If you use a stepladder:  Make sure that it is fully opened. Do not climb a closed stepladder.  Make sure that both sides of the stepladder are locked into place.  Ask someone to hold it for you, if possible.  Clearly mark and make sure that you can see:  Any grab bars or handrails.  First and last steps.  Where the edge of each step is.  Use tools that help you move around (mobility aids) if they are needed. These include:  Canes.  Walkers.  Scooters.  Crutches.  Turn on the lights when you go into a dark area. Replace any light bulbs as soon as they burn out.  Set up your furniture so you have a clear path. Avoid moving your furniture around.  If any of your floors are uneven, fix them.  If there are any pets around you, be aware of where they are.  Review your medicines with your doctor. Some medicines can make you feel dizzy. This can increase your chance of falling. Ask your doctor what other things that you can do to help prevent falls. This information is not intended to replace advice given to you by your health care provider. Make sure you discuss any questions you have with your health care provider. Document Released: 06/28/2009 Document Revised: 02/07/2016 Document Reviewed: 10/06/2014 Elsevier Interactive Patient Education  2017 [employer].

## 2018-01-14 NOTE — Progress Notes (Signed)
Subjective:   Yvette Rogers is a 72 y.o. female who presents for Medicare Annual (Subsequent) preventive examination.  Review of Systems:  N/A   Cardiac Risk Factors include: advanced age (>60men, >66 women);obesity (BMI >30kg/m2)     Objective:     Vitals: BP 130/84 (BP Location: Right Arm)   Pulse 80   Temp 98.1 F (36.7 C) (Oral)   Ht 5\' 6"  (1.676 m)   LMP  (LMP Unknown)   BMI 27.44 kg/m   Body mass index is 27.44 kg/m.  Advanced Directives 01/14/2018 01/10/2018 09/25/2016 05/18/2016  Does Patient Have a Medical Advance Directive? No No Yes No  Would patient like information on creating a medical advance directive? Yes (MAU/Ambulatory/Procedural Areas - Information given) No - Patient declined - No - patient declined information    Tobacco Social History   Tobacco Use  Smoking Status Never Smoker  Smokeless Tobacco Never Used     Counseling given: Not Answered   Clinical Intake:  Pre-visit preparation completed: Yes  Pain : No/denies pain Pain Score: 0-No pain     Nutritional Risks: None Diabetes: No  How often do you need to have someone help you when you read instructions, pamphlets, or other written materials from your doctor or pharmacy?: 1 - Never  Interpreter Needed?: No  Information entered by :: [facility], LPN  Past Medical History:  Diagnosis Date  . Anxiety   . Cancer ([place])    skin ca   Past Surgical History:  Procedure Laterality Date  . BUNIONECTOMY Right   . CHOLECYSTECTOMY    . EYE SURGERY Bilateral    Cataract removed  . GALLBLADDER SURGERY    . SKIN CANCER EXCISION     face  . SKIN CANCER EXCISION Left 08/22/2013   left nostril area   Family History  Problem Relation Age of Onset  . Cancer Mother        pancreatic  . Diabetes Mother   . Kidney Stones Mother   . COPD Father   . Diabetes Father   . Macular degeneration Father   . Breast cancer Sister 43  . Cancer Brother        Leukemia  . Breast cancer Cousin    mat cousin   Social History   Socioeconomic History  . Marital status: Married    Spouse name: Not on file  . Number of children: 1  . Years of education: Not on file  . Highest education level: 12th grade  Occupational History  . Occupation: Public librarian / clerical  Social Needs  . Financial resource strain: Not hard at all  . Food insecurity:    Worry: Never true    Inability: Never true  . Transportation needs:    Medical: No    Non-medical: No  Tobacco Use  . Smoking status: Never Smoker  . Smokeless tobacco: Never Used  Substance and Sexual Activity  . Alcohol use: Not Currently  . Drug use: No  . Sexual activity: Not on file  Lifestyle  . Physical activity:    Days per week: Not on file    Minutes per session: Not on file  . Stress: Very much  Relationships  . Social connections:    Talks on phone: Not on file    Gets together: Not on file    Attends religious service: Not on file    Active member of club or organization: Not on file    Attends meetings of clubs or  organizations: Not on file    Relationship status: Not on file  Other Topics Concern  . Not on file  Social History Narrative  . Not on file    Outpatient Encounter Medications as of 01/14/2018  Medication Sig  . azelastine (OPTIVAR) 0.05 % ophthalmic solution Place 1 drop into both eyes 2 (two) times daily.   Marland Kitchen buPROPion (WELLBUTRIN XL) 300 MG 24 hr tablet Take 1 tablet (300 mg total) by mouth daily.  . clonazePAM (KLONOPIN) 0.5 MG tablet Take 1 tablet (0.5 mg total) by mouth 2 (two) times daily as needed for anxiety.  . meloxicam (MOBIC) 15 MG tablet TAKE 1 TABLET BY MOUTH ONCE DAILY IF NEEDED FOR PAIN  . traZODone (DESYREL) 150 MG tablet Take 1 tablet (150 mg total) by mouth at bedtime.  . phenazopyridine (PYRIDIUM) 200 MG tablet Take 1 tablet (200 mg total) by mouth 3 (three) times daily as needed for pain. (Patient not taking: Reported on 01/14/2018)   No facility-administered encounter  medications on file as of 01/14/2018.     Activities of Daily Living In your present state of health, do you have any difficulty performing the following activities: 01/14/2018 11/17/2017  Hearing? N N  Vision? N N  Difficulty concentrating or making decisions? Y N  Walking or climbing stairs? N N  Dressing or bathing? N N  Doing errands, shopping? N N  Preparing Food and eating ? N -  Using the Toilet? N -  In the past six months, have you accidently leaked urine? Y -  Comment Occasionally, wear protection -  Do you have problems with loss of bowel control? N -  Managing your Medications? N -  Managing your Finances? N -  Housekeeping or managing your Housekeeping? N -  Some recent data might be hidden    Patient Care Team: Jerrol Banana., MD as PCP - General (Family Medicine)    Assessment:   This is a routine wellness examination for Yvette Rogers.  Exercise Activities and Dietary recommendations Current Exercise Habits: The patient does not participate in regular exercise at present, Exercise limited by: None identified  Goals    . Exercise 3x per week (30 min per time)     Recommend to start exercising 3 days a week for at least 30 minutes at a time.       Fall Risk Fall Risk  01/14/2018 11/17/2017 09/25/2016 12/06/2015 09/06/2015  Falls in the past year? No No No No No   Is the patient's home free of loose throw rugs in walkways, pet beds, electrical cords, etc?   yes      Grab bars in the bathroom? no      Handrails on the stairs?   no      Adequate lighting?   yes  Timed Get Up and Go performed: N/A  Depression Screen PHQ 2/9 Scores 01/14/2018 11/17/2017 03/26/2017 03/26/2017  PHQ - 2 Score 4 3 4 4   PHQ- 9 Score 14 11 14 14      Cognitive Function: Pt declined screening today.      6CIT Screen 09/25/2016  What Year? 0 points  What month? 0 points  What time? 0 points  Count back from 20 0 points  Months in reverse 0 points  Repeat phrase 2 points  Total Score 2     Immunization History  Administered Date(s) Administered  . Influenza, Seasonal, Injecte, Preservative Fre 07/10/2016  . Influenza,inj,Quad PF,6+ Mos 07/03/2017  . PPD Test 12/11/2015,  12/25/2015  . Pneumococcal Conjugate-13 09/06/2015  . Pneumococcal Polysaccharide-23 09/25/2016  . Tdap 02/28/2013  . Zoster 02/28/2013    Qualifies for Shingles Vaccine? Due for Shingles vaccine. Declined my offer to administer today. Education has been provided regarding the importance of this vaccine. Pt has been advised to call her insurance company to determine her out of pocket expense. Advised she may also receive this vaccine at her local pharmacy or Health Dept. Verbalized acceptance and understanding.  Screening Tests Health Maintenance  Topic Date Due  . DEXA SCAN  07/29/2013  . INFLUENZA VACCINE  04/15/2018  . MAMMOGRAM  10/22/2019  . TETANUS/TDAP  03/01/2023  . COLONOSCOPY  08/23/2023  . Hepatitis C Screening  Completed  . PNA vac Low Risk Adult  Completed    Cancer Screenings: Lung: Low Dose CT Chest recommended if Age 45-80 years, 30 pack-year currently smoking OR have quit w/in 15years. Patient does not qualify. Breast:  Up to date on Mammogram? Yes   Up to date of Bone Density/Dexa? Unsure, no date on file. Pt states that her last DEXA scan was 5 years ago and she does not recall the results. New referral sent today. Colorectal: Up to date  Additional Screenings:  Hepatitis C Screening: Up to date     Plan:  I have personally reviewed and addressed the Medicare Annual Wellness questionnaire and have noted the following in the patient's chart:  A. Medical and social history B. Use of alcohol, tobacco or illicit drugs  C. Current medications and supplements D. Functional ability and status E.  Nutritional status F.  Physical activity G. Advance directives H. List of other physicians I.  Hospitalizations, surgeries, and ER visits in previous 12 months J.   [place] such as hearing and vision if needed, cognitive and depression L. Referrals and appointments - none  In addition, I have reviewed and discussed with patient certain preventive protocols, quality metrics, and best practice recommendations. A written personalized care plan for preventive services as well as general preventive health recommendations were provided to patient.  See attached scanned questionnaire for additional information.   Signed,  Fabio Neighbors, LPN Nurse Health Advisor   Nurse Recommendations: Referral for DEXA sent today.

## 2018-01-18 DIAGNOSIS — R293 Abnormal posture: Secondary | ICD-10-CM | POA: Diagnosis not present

## 2018-01-18 DIAGNOSIS — M62830 Muscle spasm of back: Secondary | ICD-10-CM | POA: Diagnosis not present

## 2018-01-18 DIAGNOSIS — M9902 Segmental and somatic dysfunction of thoracic region: Secondary | ICD-10-CM | POA: Diagnosis not present

## 2018-01-18 DIAGNOSIS — M62411 Contracture of muscle, right shoulder: Secondary | ICD-10-CM | POA: Diagnosis not present

## 2018-01-18 DIAGNOSIS — M9901 Segmental and somatic dysfunction of cervical region: Secondary | ICD-10-CM | POA: Diagnosis not present

## 2018-01-20 DIAGNOSIS — M9901 Segmental and somatic dysfunction of cervical region: Secondary | ICD-10-CM | POA: Diagnosis not present

## 2018-01-20 DIAGNOSIS — M62830 Muscle spasm of back: Secondary | ICD-10-CM | POA: Diagnosis not present

## 2018-01-20 DIAGNOSIS — M62411 Contracture of muscle, right shoulder: Secondary | ICD-10-CM | POA: Diagnosis not present

## 2018-01-20 DIAGNOSIS — M9902 Segmental and somatic dysfunction of thoracic region: Secondary | ICD-10-CM | POA: Diagnosis not present

## 2018-01-20 DIAGNOSIS — R293 Abnormal posture: Secondary | ICD-10-CM | POA: Diagnosis not present

## 2018-01-21 DIAGNOSIS — M9901 Segmental and somatic dysfunction of cervical region: Secondary | ICD-10-CM | POA: Diagnosis not present

## 2018-01-21 DIAGNOSIS — M9902 Segmental and somatic dysfunction of thoracic region: Secondary | ICD-10-CM | POA: Diagnosis not present

## 2018-01-21 DIAGNOSIS — R293 Abnormal posture: Secondary | ICD-10-CM | POA: Diagnosis not present

## 2018-01-21 DIAGNOSIS — M62411 Contracture of muscle, right shoulder: Secondary | ICD-10-CM | POA: Diagnosis not present

## 2018-01-21 DIAGNOSIS — M62830 Muscle spasm of back: Secondary | ICD-10-CM | POA: Diagnosis not present

## 2018-01-25 DIAGNOSIS — M62411 Contracture of muscle, right shoulder: Secondary | ICD-10-CM | POA: Diagnosis not present

## 2018-01-25 DIAGNOSIS — M9902 Segmental and somatic dysfunction of thoracic region: Secondary | ICD-10-CM | POA: Diagnosis not present

## 2018-01-25 DIAGNOSIS — R293 Abnormal posture: Secondary | ICD-10-CM | POA: Diagnosis not present

## 2018-01-25 DIAGNOSIS — M62830 Muscle spasm of back: Secondary | ICD-10-CM | POA: Diagnosis not present

## 2018-01-25 DIAGNOSIS — M9901 Segmental and somatic dysfunction of cervical region: Secondary | ICD-10-CM | POA: Diagnosis not present

## 2018-01-27 DIAGNOSIS — M9901 Segmental and somatic dysfunction of cervical region: Secondary | ICD-10-CM | POA: Diagnosis not present

## 2018-01-27 DIAGNOSIS — M62830 Muscle spasm of back: Secondary | ICD-10-CM | POA: Diagnosis not present

## 2018-01-27 DIAGNOSIS — R293 Abnormal posture: Secondary | ICD-10-CM | POA: Diagnosis not present

## 2018-01-27 DIAGNOSIS — M62411 Contracture of muscle, right shoulder: Secondary | ICD-10-CM | POA: Diagnosis not present

## 2018-01-27 DIAGNOSIS — M9902 Segmental and somatic dysfunction of thoracic region: Secondary | ICD-10-CM | POA: Diagnosis not present

## 2018-01-28 DIAGNOSIS — M9901 Segmental and somatic dysfunction of cervical region: Secondary | ICD-10-CM | POA: Diagnosis not present

## 2018-01-28 DIAGNOSIS — M62411 Contracture of muscle, right shoulder: Secondary | ICD-10-CM | POA: Diagnosis not present

## 2018-01-28 DIAGNOSIS — M62830 Muscle spasm of back: Secondary | ICD-10-CM | POA: Diagnosis not present

## 2018-01-28 DIAGNOSIS — M9902 Segmental and somatic dysfunction of thoracic region: Secondary | ICD-10-CM | POA: Diagnosis not present

## 2018-01-28 DIAGNOSIS — R293 Abnormal posture: Secondary | ICD-10-CM | POA: Diagnosis not present

## 2018-02-01 DIAGNOSIS — M9901 Segmental and somatic dysfunction of cervical region: Secondary | ICD-10-CM | POA: Diagnosis not present

## 2018-02-01 DIAGNOSIS — M9902 Segmental and somatic dysfunction of thoracic region: Secondary | ICD-10-CM | POA: Diagnosis not present

## 2018-02-01 DIAGNOSIS — M62830 Muscle spasm of back: Secondary | ICD-10-CM | POA: Diagnosis not present

## 2018-02-01 DIAGNOSIS — R293 Abnormal posture: Secondary | ICD-10-CM | POA: Diagnosis not present

## 2018-02-01 DIAGNOSIS — M62411 Contracture of muscle, right shoulder: Secondary | ICD-10-CM | POA: Diagnosis not present

## 2018-02-09 DIAGNOSIS — M9902 Segmental and somatic dysfunction of thoracic region: Secondary | ICD-10-CM | POA: Diagnosis not present

## 2018-02-09 DIAGNOSIS — M62411 Contracture of muscle, right shoulder: Secondary | ICD-10-CM | POA: Diagnosis not present

## 2018-02-09 DIAGNOSIS — R293 Abnormal posture: Secondary | ICD-10-CM | POA: Diagnosis not present

## 2018-02-09 DIAGNOSIS — M62830 Muscle spasm of back: Secondary | ICD-10-CM | POA: Diagnosis not present

## 2018-02-09 DIAGNOSIS — M9901 Segmental and somatic dysfunction of cervical region: Secondary | ICD-10-CM | POA: Diagnosis not present

## 2018-02-22 ENCOUNTER — Other Ambulatory Visit: Payer: PPO

## 2018-03-05 ENCOUNTER — Ambulatory Visit (INDEPENDENT_AMBULATORY_CARE_PROVIDER_SITE_OTHER): Payer: PPO | Admitting: Family Medicine

## 2018-03-05 ENCOUNTER — Encounter: Payer: Self-pay | Admitting: Family Medicine

## 2018-03-05 VITALS — BP 120/82 | HR 86 | Temp 97.5°F | Resp 16

## 2018-03-05 DIAGNOSIS — F419 Anxiety disorder, unspecified: Secondary | ICD-10-CM

## 2018-03-05 DIAGNOSIS — Z6981 Encounter for mental health services for victim of other abuse: Secondary | ICD-10-CM

## 2018-03-05 DIAGNOSIS — S0101XA Laceration without foreign body of scalp, initial encounter: Secondary | ICD-10-CM | POA: Diagnosis not present

## 2018-03-05 DIAGNOSIS — F0781 Postconcussional syndrome: Secondary | ICD-10-CM | POA: Diagnosis not present

## 2018-03-05 MED ORDER — CLONAZEPAM 1 MG PO TABS: 0.5000 mg | ORAL_TABLET | Freq: Two times a day (BID) | ORAL | 0 refills | Status: DC | PRN

## 2018-03-05 NOTE — Patient Instructions (Signed)
Post-Concussion Syndrome Post-concussion syndrome describes the symptoms that can occur after a head injury. These symptoms can last from weeks to months. What are the causes? It is not clear why some head injuries cause post-concussion syndrome. It can occur whether your head injury was mild or severe and whether you were wearing head protection or not. What are the signs or symptoms?  Memory difficulties.  Dizziness.  Headaches.  Double vision or blurry vision.  Sensitivity to light.  Hearing difficulties.  Depression.  Tiredness.  Weakness.  Difficulty with concentration.  Difficulty sleeping or staying asleep.  Vomiting.  Poor balance or instability on your feet.  Slow reaction time.  Difficulty learning and remembering things you have heard. How is this diagnosed? There is no test to determine whether you have post-concussion syndrome. Your health care provider may order an imaging scan of your brain, such as a CT scan, to check for other problems that may be causing your symptoms (such as a severe injury inside your skull). How is this treated? Usually, these problems disappear over time without medical care. Your health care provider may prescribe medicine to help ease your symptoms. It is important to follow up with a neurologist to evaluate your recovery and address any lingering symptoms or issues. Follow these instructions at home:  Take medicines only as directed by your health care provider. Do not take aspirin. Aspirin can slow blood clotting.  Sleep with your head slightly elevated to help with headaches.  Avoid any situation where there is potential for another head injury. This includes football, hockey, soccer, basketball, martial arts, downhill snow sports, and horseback riding. Your condition will get worse every time you experience a concussion. You should avoid these activities until you are evaluated by the appropriate follow-up health care  providers.  Keep all follow-up visits as directed by your health care provider. This is important. Contact a health care provider if:  You have increased problems paying attention or concentrating.  You have increased difficulty remembering or learning new information.  You need more time to complete tasks or assignments than before.  You have increased irritability or decreased ability to cope with stress.  You have more symptoms than before. Seek medical care if you have any of the following symptoms for more than two weeks after your injury:  Lasting (chronic) headaches.  Dizziness or balance problems.  Nausea.  Vision problems.  Increased sensitivity to noise or light.  Depression or mood swings.  Anxiety or irritability.  Memory problems.  Difficulty concentrating or paying attention.  Sleep problems.  Feeling tired all the time.  Get help right away if:  You have confusion or unusual drowsiness.  Others find it difficult to wake you up.  You have nausea or persistent, forceful vomiting.  You feel like you are moving when you are not (vertigo). Your eyes may move rapidly back and forth.  You have convulsions or faint.  You have severe, persistent headaches that are not relieved by medicine.  You cannot use your arms or legs normally.  One of your pupils is larger than the other.  You have clear or bloody discharge from your nose or ears.  Your problems are getting worse, not better. This information is not intended to replace advice given to you by your health care provider. Make sure you discuss any questions you have with your health care provider. Document Released: 02/21/2002 Document Revised: 03/21/2016 Document Reviewed: 12/07/2013 Elsevier Interactive Patient Education  2018 [employer]  
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
## 2018-04-05 ENCOUNTER — Ambulatory Visit: Payer: Self-pay | Admitting: Family Medicine

## 2018-04-06 ENCOUNTER — Ambulatory Visit (INDEPENDENT_AMBULATORY_CARE_PROVIDER_SITE_OTHER): Payer: PPO | Admitting: Family Medicine

## 2018-04-06 VITALS — BP 138/85 | HR 93 | Temp 97.8°F | Resp 16

## 2018-04-06 DIAGNOSIS — G4709 Other insomnia: Secondary | ICD-10-CM

## 2018-04-06 DIAGNOSIS — F419 Anxiety disorder, unspecified: Secondary | ICD-10-CM | POA: Diagnosis not present

## 2018-04-06 DIAGNOSIS — M25572 Pain in left ankle and joints of left foot: Secondary | ICD-10-CM

## 2018-04-06 DIAGNOSIS — F331 Major depressive disorder, recurrent, moderate: Secondary | ICD-10-CM | POA: Diagnosis not present

## 2018-04-06 DIAGNOSIS — S060X0A Concussion without loss of consciousness, initial encounter: Secondary | ICD-10-CM | POA: Diagnosis not present

## 2018-04-06 DIAGNOSIS — M25571 Pain in right ankle and joints of right foot: Secondary | ICD-10-CM | POA: Diagnosis not present

## 2018-04-06 MED ORDER — BUPROPION HCL ER (XL) 300 MG PO TB24: 300.0000 mg | ORAL_TABLET | Freq: Every day | ORAL | 3 refills | Status: DC

## 2018-04-06 MED ORDER — TRAZODONE HCL 150 MG PO TABS: 150.0000 mg | ORAL_TABLET | Freq: Every day | ORAL | 3 refills | Status: DC

## 2018-04-06 MED ORDER — CLONAZEPAM 1 MG PO TABS: 0.5000 mg | ORAL_TABLET | Freq: Two times a day (BID) | ORAL | 0 refills | Status: DC | PRN

## 2018-04-06 MED ORDER — MELOXICAM 15 MG PO TABS: ORAL_TABLET | ORAL | 0 refills | Status: DC
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
## 2018-04-29 ENCOUNTER — Other Ambulatory Visit: Payer: Self-pay | Admitting: Family Medicine

## 2018-05-20 ENCOUNTER — Ambulatory Visit: Payer: Self-pay | Admitting: Family Medicine

## 2018-05-24 ENCOUNTER — Encounter: Payer: Self-pay | Admitting: Family Medicine

## 2018-05-24 ENCOUNTER — Ambulatory Visit (INDEPENDENT_AMBULATORY_CARE_PROVIDER_SITE_OTHER): Payer: PPO | Admitting: Family Medicine

## 2018-05-24 VITALS — BP 134/82 | HR 90 | Temp 98.5°F

## 2018-05-24 DIAGNOSIS — F419 Anxiety disorder, unspecified: Secondary | ICD-10-CM

## 2018-05-24 DIAGNOSIS — F329 Major depressive disorder, single episode, unspecified: Secondary | ICD-10-CM | POA: Diagnosis not present

## 2018-05-24 DIAGNOSIS — G4709 Other insomnia: Secondary | ICD-10-CM | POA: Diagnosis not present

## 2018-05-24 DIAGNOSIS — K219 Gastro-esophageal reflux disease without esophagitis: Secondary | ICD-10-CM

## 2018-05-24 MED ORDER — OMEPRAZOLE 20 MG PO CPDR: 20.0000 mg | DELAYED_RELEASE_CAPSULE | Freq: Every day | ORAL | 3 refills | Status: DC

## 2018-05-24 NOTE — Progress Notes (Signed)
Patient: Yvette Rogers Female    DOB: 1946/01/05   72 y.o.   MRN: [id number] Visit Date: 05/24/2018  Today's Provider: [name], MD   Chief Complaint  Patient presents with  . Depression  . Anxiety   Subjective:    HPI Patient comes in today for a follow up. She was last seen in the office 6 months ago.   Depression- Patient is currently taking bupropion 300mg  daily, and reports good compliance.   Insomnia- Patient reports that the Trazodone helps with her sleep at night. She is requesting more refills.    Allergies  Allergen Reactions  . Bactrim [Sulfamethoxazole-Trimethoprim] Swelling  . Penicillin V Potassium Hives  . Penicillins Hives     Current Outpatient Medications:  .  azelastine (OPTIVAR) 0.05 % ophthalmic solution, Place 1 drop into both eyes 2 (two) times daily. , Disp: , Rfl: 4 .  buPROPion (WELLBUTRIN XL) 300 MG 24 hr tablet, Take 1 tablet (300 mg total) by mouth daily., Disp: 90 tablet, Rfl: 3 .  clonazePAM (KLONOPIN) 1 MG tablet, Take 0.5-1 tablets (0.5-1 mg total) by mouth 2 (two) times daily as needed for anxiety., Disp: 60 tablet, Rfl: 0 .  meloxicam (MOBIC) 15 MG tablet, TAKE 1 TABLET BY MOUTH ONCE DAILY IF NEEDED FOR PAIN, Disp: 90 tablet, Rfl: 0 .  traZODone (DESYREL) 150 MG tablet, Take 1 tablet (150 mg total) by mouth at bedtime., Disp: 90 tablet, Rfl: 3 .  clonazePAM (KLONOPIN) 0.5 MG tablet, TAKE 1 TABLET BY MOUTH TWICE DAILY AS NEEDED FOR ANXIETY (Patient not taking: Reported on 05/24/2018), Disp: 60 tablet, Rfl: 3  Review of Systems  Constitutional: Negative for activity change, appetite change, chills, diaphoresis, fatigue and unexpected weight change.  HENT: Negative.   Eyes: Negative.   Respiratory: Negative for shortness of breath.   Cardiovascular: Negative for chest pain, palpitations and leg swelling.  Gastrointestinal: Negative.   Endocrine: Negative.   Allergic/Immunologic: Negative.   Neurological: Negative.     Psychiatric/Behavioral: Positive for agitation and decreased concentration. Negative for self-injury, sleep disturbance and suicidal ideas. The patient is nervous/anxious.     Social History   Tobacco Use  . Smoking status: Never Smoker  . Smokeless tobacco: Never Used  Substance Use Topics  . Alcohol use: Not Currently   Objective:   BP 134/82   Pulse 90   Temp 98.5 F (36.9 C)   LMP  (LMP Unknown)   SpO2 96%  Vitals:   05/24/18 1619  BP: 134/82  Pulse: 90  Temp: 98.5 F (36.9 C)  SpO2: 96%     Physical Exam  Constitutional: She is oriented to person, place, and time. She appears well-developed and well-nourished.  HENT:  Head: Normocephalic and atraumatic.  Eyes: Conjunctivae are normal. No scleral icterus.  Neck: No thyromegaly present.  Cardiovascular: Normal rate, regular rhythm and normal heart sounds.  Pulmonary/Chest: Effort normal and breath sounds normal.  Abdominal: Soft.  Musculoskeletal: She exhibits no edema.  Neurological: She is alert and oriented to person, place, and time.  Skin: Skin is warm and dry.  Psychiatric: She has a normal mood and affect. Her behavior is normal. Judgment and thought content normal.        Assessment & Plan:     1. Anxiety   2. Other insomnia   3. Gastroesophageal reflux disease, esophagitis presence not specified  - omeprazole (PRILOSEC) 20 MG capsule; Take 1 capsule (20 mg total) by mouth daily.  Dispense: 30 capsule; Refill: 3 4.Reactive depression     I have done the exam and reviewed the above chart and it is accurate to the best of my knowledge. Development worker, community has been used in this note in any air is in the dictation or transcription are unintentional.  [name], MD  [place]

## 2018-07-12 ENCOUNTER — Other Ambulatory Visit: Payer: Self-pay | Admitting: Family Medicine

## 2018-07-12 DIAGNOSIS — M25572 Pain in left ankle and joints of left foot: Principal | ICD-10-CM

## 2018-07-12 DIAGNOSIS — M25571 Pain in right ankle and joints of right foot: Secondary | ICD-10-CM

## 2018-07-27 ENCOUNTER — Encounter: Payer: Self-pay | Admitting: Family Medicine

## 2018-07-27 ENCOUNTER — Ambulatory Visit (INDEPENDENT_AMBULATORY_CARE_PROVIDER_SITE_OTHER): Payer: PPO | Admitting: Family Medicine

## 2018-07-27 VITALS — BP 126/74 | HR 72 | Temp 97.9°F | Resp 16

## 2018-07-27 DIAGNOSIS — N390 Urinary tract infection, site not specified: Secondary | ICD-10-CM | POA: Diagnosis not present

## 2018-07-27 DIAGNOSIS — F419 Anxiety disorder, unspecified: Secondary | ICD-10-CM | POA: Diagnosis not present

## 2018-07-27 DIAGNOSIS — F329 Major depressive disorder, single episode, unspecified: Secondary | ICD-10-CM

## 2018-07-27 DIAGNOSIS — R319 Hematuria, unspecified: Secondary | ICD-10-CM

## 2018-07-27 LAB — POCT URINALYSIS DIPSTICK
BILIRUBIN UA: NEGATIVE
GLUCOSE UA: NEGATIVE
Ketones, UA: NEGATIVE
NITRITE UA: POSITIVE
PH UA: 7 (ref 5.0–8.0)
Protein, UA: POSITIVE — AB
Spec Grav, UA: >=1.03 — AB (ref 1.010–1.025)
UROBILINOGEN UA: 0.2 U/dL

## 2018-07-27 MED ORDER — NITROFURANTOIN MONOHYD MACRO 100 MG PO CAPS: 100.0000 mg | ORAL_CAPSULE | Freq: Two times a day (BID) | ORAL | 0 refills | Status: AC

## 2018-07-27 NOTE — Progress Notes (Signed)
Patient: Yvette Rogers Female    DOB: [birth year]   72 y.o.   MRN: [id number] Visit Date: 07/27/2018  Today's Provider: [name], MD   Chief Complaint  Patient presents with  . Follow-up  . Urinary Tract Infection   Subjective:    HPI Patient comes in today for a follow up. She was last seen in the office 2 months. She feels well today with minor complaints. She feels that she may have a UTI. She has had symptoms X 3 days. Has been on AZO with minimal relief. She is again separated from her husband.  She is also requesting refills on clonazepam.     Allergies  Allergen Reactions  . Bactrim [Sulfamethoxazole-Trimethoprim] Swelling  . Penicillin V Potassium Hives  . Penicillins Hives     Current Outpatient Medications:  .  azelastine (OPTIVAR) 0.05 % ophthalmic solution, Place 1 drop into both eyes 2 (two) times daily. , Disp: , Rfl: 4 .  buPROPion (WELLBUTRIN XL) 300 MG 24 hr tablet, Take 1 tablet (300 mg total) by mouth daily., Disp: 90 tablet, Rfl: 3 .  clonazePAM (KLONOPIN) 1 MG tablet, Take 0.5-1 tablets (0.5-1 mg total) by mouth 2 (two) times daily as needed for anxiety., Disp: 60 tablet, Rfl: 0 .  meloxicam (MOBIC) 15 MG tablet, TAKE 1 TABLET BY MOUTH ONCE DAILY IF NEEDED FOR PAIN, Disp: 90 tablet, Rfl: 0 .  omeprazole (PRILOSEC) 20 MG capsule, Take 1 capsule (20 mg total) by mouth daily., Disp: 30 capsule, Rfl: 3 .  traZODone (DESYREL) 150 MG tablet, Take 1 tablet (150 mg total) by mouth at bedtime., Disp: 90 tablet, Rfl: 3 .  clonazePAM (KLONOPIN) 0.5 MG tablet, TAKE 1 TABLET BY MOUTH TWICE DAILY AS NEEDED FOR ANXIETY (Patient not taking: Reported on 05/24/2018), Disp: 60 tablet, Rfl: 3  Review of Systems  Constitutional: Positive for fatigue. Negative for activity change, chills and fever.  HENT: Negative.   Eyes: Negative.   Respiratory: Negative for cough and shortness of breath.   Cardiovascular: Negative for chest pain, palpitations and leg  swelling.  Gastrointestinal: Negative for abdominal pain, constipation, diarrhea, nausea, rectal pain and vomiting.  Genitourinary: Positive for dysuria, flank pain and frequency. Negative for decreased urine volume, hematuria, urgency, vaginal bleeding, vaginal discharge and vaginal pain.  Musculoskeletal: Positive for myalgias. Negative for arthralgias.  Neurological: Negative for dizziness and headaches.  Psychiatric/Behavioral: Positive for agitation and sleep disturbance. Negative for self-injury and suicidal ideas. The patient is nervous/anxious.     Social History   Tobacco Use  . Smoking status: Never Smoker  . Smokeless tobacco: Never Used  Substance Use Topics  . Alcohol use: Not Currently   Objective:   BP 126/74   Pulse 72   Temp 97.9 F (36.6 C)   Resp 16   LMP  (LMP Unknown)   SpO2 99%  Vitals:   07/27/18 1628  BP: 126/74  Pulse: 72  Resp: 16  Temp: 97.9 F (36.6 C)  SpO2: 99%     Physical Exam  Constitutional: She is oriented to person, place, and time. She appears well-developed and well-nourished.  HENT:  Head: Normocephalic and atraumatic.  Eyes: Conjunctivae are normal.  Neck: No thyromegaly present.  Cardiovascular: Normal rate, regular rhythm and normal heart sounds.  Pulmonary/Chest: Effort normal and breath sounds normal.  Abdominal: Soft.  Musculoskeletal: She exhibits no edema.  Neurological: She is alert and oriented to person, place, and time.  Skin: Skin  is warm and dry.  Psychiatric: She has a normal mood and affect. Her behavior is normal. Judgment and thought content normal.        Assessment & Plan:     1. Urinary tract infection with hematuria, site unspecified  - Urine Culture - nitrofurantoin, macrocrystal-monohydrate, (MACROBID) 100 MG capsule; Take 1 capsule (100 mg total) by mouth 2 (two) times daily for 5 days.  Dispense: 10 capsule; Refill: 0 - POCT urinalysis dipstick  2. Reactive depression Partial remission.RTC 6  months.  3. Anxiety Stable  I have done the exam and reviewed the chart and it is accurate to the best of my knowledge. Development worker, community has been used and  any errors in dictation or transcription are unintentional. Miguel Aschoff M.D. [place], MD  [facility]

## 2018-07-29 LAB — URINE CULTURE

## 2018-08-03 ENCOUNTER — Telehealth: Payer: Self-pay | Admitting: Family Medicine

## 2018-08-03 NOTE — Telephone Encounter (Signed)
Urine cx was done on 07/27/18 and she was treated with nitrofurantion. She reports that she is not better. Please advise. Thanks!

## 2018-08-03 NOTE — Telephone Encounter (Signed)
Pt states she would like an RX called into pharmacy for the UTI.  States she isn't feeling like it has been cleared up.  She is wanting to know when RX is called in.   [facility] in [place]

## 2018-08-06 ENCOUNTER — Other Ambulatory Visit: Payer: Self-pay | Admitting: *Deleted

## 2018-08-06 DIAGNOSIS — K219 Gastro-esophageal reflux disease without esophagitis: Secondary | ICD-10-CM

## 2018-08-06 DIAGNOSIS — Z86010 Personal history of colonic polyps: Secondary | ICD-10-CM | POA: Diagnosis not present

## 2018-08-06 MED ORDER — OMEPRAZOLE 20 MG PO CPDR: 20.0000 mg | DELAYED_RELEASE_CAPSULE | Freq: Every day | ORAL | 3 refills | Status: DC

## 2018-08-17 ENCOUNTER — Ambulatory Visit: Payer: Self-pay | Admitting: Family Medicine

## 2018-09-23 ENCOUNTER — Ambulatory Visit (INDEPENDENT_AMBULATORY_CARE_PROVIDER_SITE_OTHER): Payer: PPO | Admitting: Physician Assistant

## 2018-09-23 ENCOUNTER — Encounter: Payer: Self-pay | Admitting: Physician Assistant

## 2018-09-23 VITALS — BP 117/76 | HR 64 | Temp 98.2°F | Resp 20

## 2018-09-23 DIAGNOSIS — J0110 Acute frontal sinusitis, unspecified: Secondary | ICD-10-CM | POA: Diagnosis not present

## 2018-09-23 MED ORDER — DOXYCYCLINE HYCLATE 100 MG PO TABS: 100.0000 mg | ORAL_TABLET | Freq: Two times a day (BID) | ORAL | 0 refills | Status: DC

## 2018-09-23 NOTE — Patient Instructions (Signed)

## 2018-09-23 NOTE — Progress Notes (Signed)
Patient: Yvette Rogers Female    DOB: 07-21-1946   73 y.o.   MRN: [id number] Visit Date: 09/23/2018  Today's Provider: Trinna Post, PA-C   Chief Complaint  Patient presents with  . URI   Subjective:     HPI Upper Respiratory Infection: Patient complains of symptoms of a URI, possible sinusitis. Symptoms include bilateral ear pain and congestion. Onset of symptoms was 2 weeks ago, gradually worsening since that time. She also c/o congestion, facial pain and nasal congestion for the past 2 weeks .  She is drinking plenty of fluids. Evaluation to date: none. Treatment to date: none.  Patient also asking for refill of klonopin. Reports she is going through divorce. Last had klonopin 03/2018.   Allergies  Allergen Reactions  . Bactrim [Sulfamethoxazole-Trimethoprim] Swelling  . Penicillin V Potassium Hives  . Penicillins Hives     Current Outpatient Medications:  .  azelastine (OPTIVAR) 0.05 % ophthalmic solution, Place 1 drop into both eyes 2 (two) times daily. , Disp: , Rfl: 4 .  buPROPion (WELLBUTRIN XL) 300 MG 24 hr tablet, Take 1 tablet (300 mg total) by mouth daily., Disp: 90 tablet, Rfl: 3 .  clonazePAM (KLONOPIN) 1 MG tablet, Take 0.5-1 tablets (0.5-1 mg total) by mouth 2 (two) times daily as needed for anxiety., Disp: 60 tablet, Rfl: 0 .  meloxicam (MOBIC) 15 MG tablet, TAKE 1 TABLET BY MOUTH ONCE DAILY IF NEEDED FOR PAIN, Disp: 90 tablet, Rfl: 0 .  omeprazole (PRILOSEC) 20 MG capsule, Take 1 capsule (20 mg total) by mouth daily., Disp: 30 capsule, Rfl: 3 .  traZODone (DESYREL) 150 MG tablet, Take 1 tablet (150 mg total) by mouth at bedtime., Disp: 90 tablet, Rfl: 3  Review of Systems  Constitutional: Positive for activity change and fatigue.  HENT: Positive for congestion, ear pain, sinus pressure and sinus pain.   Respiratory: Positive for chest tightness.     Social History   Tobacco Use  . Smoking status: Never Smoker  . Smokeless tobacco: Never Used    Substance Use Topics  . Alcohol use: Not Currently      Objective:   BP 117/76 (BP Location: Left Arm, Patient Position: Sitting, Cuff Size: Normal)   Pulse 64   Temp 98.2 F (36.8 C) (Oral)   Resp 20   LMP  (LMP Unknown)   SpO2 96%  Vitals:   09/23/18 1607  BP: 117/76  Pulse: 64  Resp: 20  Temp: 98.2 F (36.8 C)  TempSrc: Oral  SpO2: 96%     Physical Exam Constitutional:      General: She is not in acute distress.    Appearance: She is well-developed. She is not diaphoretic.  HENT:     Right Ear: External ear normal.     Left Ear: External ear normal.     Nose:     Right Sinus: Maxillary sinus tenderness and frontal sinus tenderness present.     Left Sinus: Maxillary sinus tenderness and frontal sinus tenderness present.     Mouth/Throat:     Pharynx: No oropharyngeal exudate or posterior oropharyngeal erythema.  Eyes:     General:        Right eye: No discharge.        Left eye: No discharge.     Conjunctiva/sclera: Conjunctivae normal.  Neck:     Musculoskeletal: Neck supple.  Cardiovascular:     Rate and Rhythm: Normal rate and regular rhythm.  Pulmonary:  Effort: Pulmonary effort is normal.     Breath sounds: Normal breath sounds.  Lymphadenopathy:     Cervical: Cervical adenopathy present.  Skin:    General: Skin is warm and dry.  Neurological:     Mental Status: She is alert and oriented to person, place, and time.  Psychiatric:        Mood and Affect: Mood normal.        Behavior: Behavior normal.         Assessment & Plan    1. Acute non-recurrent frontal sinusitis  Will defer klonpin prescription to her PCP, suggest she schedule a visit with him.  -doxycycline 100 mg BID x 7 days.   The entirety of the information documented in the History of Present Illness, Review of Systems and Physical Exam were personally obtained by me. Portions of this information were initially documented by Lynford Humphrey, CMA and reviewed by me for  thoroughness and accuracy.   Return if symptoms worsen or fail to improve.     Trinna Post, PA-C  [facility]

## 2018-09-29 ENCOUNTER — Telehealth: Payer: Self-pay | Admitting: Physician Assistant

## 2018-09-29 DIAGNOSIS — J0110 Acute frontal sinusitis, unspecified: Secondary | ICD-10-CM

## 2018-09-29 NOTE — Telephone Encounter (Signed)
Patient is still suffering from sinus infection. Says she is not a lot better.  Wants another round of antibiotics.  [facility]

## 2018-09-30 ENCOUNTER — Other Ambulatory Visit: Payer: Self-pay | Admitting: Physician Assistant

## 2018-09-30 MED ORDER — DOXYCYCLINE HYCLATE 100 MG PO TABS: 100.0000 mg | ORAL_TABLET | Freq: Two times a day (BID) | ORAL | 0 refills | Status: AC

## 2018-09-30 NOTE — Telephone Encounter (Signed)
Patient advised as below.  

## 2018-09-30 NOTE — Telephone Encounter (Signed)
Doxycycline 100 mg BID x 5 days.

## 2018-10-12 ENCOUNTER — Encounter: Payer: Self-pay | Admitting: *Deleted

## 2018-10-13 ENCOUNTER — Ambulatory Visit: Payer: PPO | Admitting: Anesthesiology

## 2018-10-13 ENCOUNTER — Ambulatory Visit
Admission: RE | Admit: 2018-10-13 | Discharge: 2018-10-13 | Disposition: A | Discharge status: Home / Self Care | Payer: PPO | Source: Ambulatory Visit | Attending: Unknown Physician Specialty | Admitting: Unknown Physician Specialty

## 2018-10-13 ENCOUNTER — Other Ambulatory Visit: Payer: Self-pay

## 2018-10-13 ENCOUNTER — Encounter
Admission: RE | Disposition: A | Discharge status: Home / Self Care | Payer: Self-pay | Source: Ambulatory Visit | Attending: Unknown Physician Specialty

## 2018-10-13 DIAGNOSIS — D124 Benign neoplasm of descending colon: Secondary | ICD-10-CM | POA: Insufficient documentation

## 2018-10-13 DIAGNOSIS — K640 First degree hemorrhoids: Secondary | ICD-10-CM | POA: Diagnosis not present

## 2018-10-13 DIAGNOSIS — F419 Anxiety disorder, unspecified: Secondary | ICD-10-CM | POA: Insufficient documentation

## 2018-10-13 DIAGNOSIS — Z880 Allergy status to penicillin: Secondary | ICD-10-CM | POA: Diagnosis not present

## 2018-10-13 DIAGNOSIS — M1990 Unspecified osteoarthritis, unspecified site: Secondary | ICD-10-CM | POA: Insufficient documentation

## 2018-10-13 DIAGNOSIS — K219 Gastro-esophageal reflux disease without esophagitis: Secondary | ICD-10-CM | POA: Diagnosis not present

## 2018-10-13 DIAGNOSIS — K648 Other hemorrhoids: Secondary | ICD-10-CM | POA: Diagnosis not present

## 2018-10-13 DIAGNOSIS — Z85828 Personal history of other malignant neoplasm of skin: Secondary | ICD-10-CM | POA: Diagnosis not present

## 2018-10-13 DIAGNOSIS — Z79899 Other long term (current) drug therapy: Secondary | ICD-10-CM | POA: Diagnosis not present

## 2018-10-13 DIAGNOSIS — Z800 Family history of malignant neoplasm of digestive organs: Secondary | ICD-10-CM | POA: Diagnosis not present

## 2018-10-13 DIAGNOSIS — F418 Other specified anxiety disorders: Secondary | ICD-10-CM | POA: Diagnosis not present

## 2018-10-13 DIAGNOSIS — Z7689 Persons encountering health services in other specified circumstances: Secondary | ICD-10-CM | POA: Diagnosis not present

## 2018-10-13 DIAGNOSIS — Z791 Long term (current) use of non-steroidal anti-inflammatories (NSAID): Secondary | ICD-10-CM | POA: Insufficient documentation

## 2018-10-13 DIAGNOSIS — K635 Polyp of colon: Secondary | ICD-10-CM | POA: Diagnosis not present

## 2018-10-13 DIAGNOSIS — Z881 Allergy status to other antibiotic agents status: Secondary | ICD-10-CM | POA: Insufficient documentation

## 2018-10-13 DIAGNOSIS — Z1211 Encounter for screening for malignant neoplasm of colon: Secondary | ICD-10-CM | POA: Insufficient documentation

## 2018-10-13 DIAGNOSIS — F329 Major depressive disorder, single episode, unspecified: Secondary | ICD-10-CM | POA: Diagnosis not present

## 2018-10-13 DIAGNOSIS — Z86010 Personal history of colonic polyps: Secondary | ICD-10-CM | POA: Diagnosis not present

## 2018-10-13 DIAGNOSIS — Z79891 Long term (current) use of opiate analgesic: Secondary | ICD-10-CM | POA: Diagnosis not present

## 2018-10-13 HISTORY — DX: Depression, unspecified: F32.A

## 2018-10-13 HISTORY — DX: Chest pain, unspecified: R07.9

## 2018-10-13 HISTORY — DX: Unspecified osteoarthritis, unspecified site: M19.90

## 2018-10-13 HISTORY — DX: Abnormal electrocardiogram (ECG) (EKG): R94.31

## 2018-10-13 HISTORY — DX: Major depressive disorder, single episode, unspecified: F32.9

## 2018-10-13 HISTORY — PX: COLONOSCOPY WITH PROPOFOL: SHX5780

## 2018-10-13 HISTORY — DX: Zoster without complications: B02.9

## 2018-10-13 SURGERY — COLONOSCOPY WITH PROPOFOL
Anesthesia: General

## 2018-10-13 MED ORDER — SODIUM CHLORIDE 0.9 % IV SOLN
INTRAVENOUS | Status: DC
  Administered 2018-10-13: 13:00:00 via INTRAVENOUS

## 2018-10-13 MED ORDER — LIDOCAINE HCL (PF) 1 % IJ SOLN
INTRAMUSCULAR | Status: AC
  Administered 2018-10-13: 0.1 mL
  Filled 2018-10-13: qty 2

## 2018-10-13 MED ORDER — PROPOFOL 500 MG/50ML IV EMUL
INTRAVENOUS | Status: DC | PRN
  Administered 2018-10-13: 125 ug/kg/min via INTRAVENOUS

## 2018-10-13 MED ORDER — PROPOFOL 10 MG/ML IV BOLUS
INTRAVENOUS | Status: DC | PRN
  Administered 2018-10-13: 50 mg via INTRAVENOUS
  Administered 2018-10-13: 30 mg via INTRAVENOUS

## 2018-10-13 MED ORDER — PROPOFOL 500 MG/50ML IV EMUL
INTRAVENOUS | Status: AC
  Filled 2018-10-13: qty 50

## 2018-10-13 MED ORDER — SODIUM CHLORIDE 0.9 % IV SOLN: INTRAVENOUS | Status: DC

## 2018-10-13 NOTE — Anesthesia Post-op Follow-up Note (Signed)
Anesthesia QCDR form completed.        

## 2018-10-13 NOTE — Transfer of Care (Signed)
Immediate Anesthesia Transfer of Care Note  Patient: Yvette Rogers  Procedure(s) Performed: COLONOSCOPY WITH PROPOFOL (N/A )  Patient Location: PACU and Endoscopy Unit  Anesthesia Type:General  Level of Consciousness: awake, alert  and oriented  Airway & Oxygen Therapy: Patient Spontanous Breathing  Post-op Assessment: Report given to RN and Post -op Vital signs reviewed and stable  Post vital signs: Reviewed and stable  Last Vitals:  Vitals Value Taken Time  BP 119/58 10/13/2018  1:50 PM  Temp    Pulse 80 10/13/2018  1:50 PM  Resp 12 10/13/2018  1:50 PM  SpO2 100 % 10/13/2018  1:50 PM  Vitals shown include unvalidated device data.  Last Pain:  Vitals:   10/13/18 1252  TempSrc: Oral  PainSc: 0-No pain         Complications: No apparent anesthesia complications

## 2018-10-13 NOTE — Anesthesia Postprocedure Evaluation (Signed)
Anesthesia Post Note  Patient: Yvette Rogers  Procedure(s) Performed: COLONOSCOPY WITH PROPOFOL (N/A )  Patient location during evaluation: Endoscopy Anesthesia Type: General Level of consciousness: awake and alert and oriented Pain management: pain level controlled Vital Signs Assessment: post-procedure vital signs reviewed and stable Respiratory status: spontaneous breathing, nonlabored ventilation and respiratory function stable Cardiovascular status: blood pressure returned to baseline and stable Postop Assessment: no signs of nausea or vomiting Anesthetic complications: no     Last Vitals:  Vitals:   10/13/18 1400 10/13/18 1410  BP: 124/69 138/60  Pulse: 72 84  Resp: (!) 21 (!) 21  Temp:    SpO2: 100% 100%    Last Pain:  Vitals:   10/13/18 1410  TempSrc:   PainSc: 0-No pain                 [name]

## 2018-10-13 NOTE — H&P (Signed)
Primary Care Physician:  Jerrol Banana., MD Primary Gastroenterologist:  Dr. Vira Agar  Pre-Procedure History & Physical: HPI:  Yvette Rogers is a 73 y.o. female is here for an colonoscopy.   Past Medical History:  Diagnosis Date  . Abnormal EKG   . Anxiety   . Arthritis   . Cancer ([place])    skin ca  . Chest pain   . Depression   . Shingles     Past Surgical History:  Procedure Laterality Date  . BUNIONECTOMY Right   . CHOLECYSTECTOMY    . COLONOSCOPY    . EYE SURGERY Bilateral    Cataract removed  . FOOT SURGERY    . GALLBLADDER SURGERY    . SKIN CANCER EXCISION     face  . SKIN CANCER EXCISION Left 08/22/2013   left nostril area  . subconjunctival     subconjunctival hemorrhage    Prior to Admission medications   Medication Sig Start Date End Date Taking? Authorizing Provider  azelastine (OPTIVAR) 0.05 % ophthalmic solution Place 1 drop into both eyes 2 (two) times daily.  01/05/18  Yes [provider]  buPROPion (WELLBUTRIN XL) 300 MG 24 hr tablet Take 1 tablet (300 mg total) by mouth daily. 04/06/18  Yes Jerrol Banana., MD  celecoxib (CELEBREX) 100 MG capsule Take 100 mg by mouth daily.   Yes [provider]  clonazePAM (KLONOPIN) 1 MG tablet Take 0.5-1 tablets (0.5-1 mg total) by mouth 2 (two) times daily as needed for anxiety. 04/06/18  Yes Jerrol Banana., MD  meloxicam (MOBIC) 15 MG tablet TAKE 1 TABLET BY MOUTH ONCE DAILY IF NEEDED FOR PAIN 07/12/18  Yes Jerrol Banana., MD  omeprazole (PRILOSEC) 20 MG capsule Take 1 capsule (20 mg total) by mouth daily. 08/06/18  Yes Jerrol Banana., MD  oxyCODONE-acetaminophen (PERCOCET) 10-325 MG tablet Take 1 tablet by mouth every 4 (four) hours as needed for pain.   Yes [provider]  sertraline (ZOLOFT) 50 MG tablet Take 50 mg by mouth daily.   Yes [provider]  traZODone (DESYREL) 150 MG tablet Take 1 tablet (150 mg total) by mouth at bedtime.  04/06/18  Yes Jerrol Banana., MD    Allergies as of 08/11/2018 - Review Complete 07/27/2018  Allergen Reaction Noted  . Bactrim [sulfamethoxazole-trimethoprim] Swelling 07/14/2017  . Penicillin v potassium Hives 01/24/2015  . Penicillins Hives 10/27/2013    Family History  Problem Relation Age of Onset  . Cancer Mother        pancreatic  . Diabetes Mother   . Kidney Stones Mother   . COPD Father   . Diabetes Father   . Macular degeneration Father   . Breast cancer Sister 59  . Cancer Brother        Leukemia  . Breast cancer Cousin        mat cousin    Social History   Socioeconomic History  . Marital status: Married    Spouse name: Not on file  . Number of children: 1  . Years of education: Not on file  . Highest education level: 12th grade  Occupational History  . Occupation: Public librarian / clerical  Social Needs  . Financial resource strain: Not hard at all  . Food insecurity:    Worry: Never true    Inability: Never true  . Transportation needs:    Medical: No    Non-medical: No  Tobacco  Use  . Smoking status: Never Smoker  . Smokeless tobacco: Never Used  Substance and Sexual Activity  . Alcohol use: Yes    Comment: rarely  . Drug use: No  . Sexual activity: Not on file  Lifestyle  . Physical activity:    Days per week: Not on file    Minutes per session: Not on file  . Stress: Very much  Relationships  . Social connections:    Talks on phone: Not on file    Gets together: Not on file    Attends religious service: Not on file    Active member of club or organization: Not on file    Attends meetings of clubs or organizations: Not on file    Relationship status: Not on file  . Intimate partner violence:    Fear of current or ex partner: Not on file    Emotionally abused: Not on file    Physically abused: Not on file    Forced sexual activity: Not on file  Other Topics Concern  . Not on file  Social History Narrative  . Not on  file    Review of Systems: See HPI, otherwise negative ROS  Physical Exam: BP (!) 157/98   Pulse 87   Temp 97.8 F (36.6 C) (Oral)   Resp 20   Ht 5' 4.5" (1.638 m)   Wt 79.4 kg   LMP  (LMP Unknown)   SpO2 99%   BMI 29.57 kg/m  General:   Alert,  pleasant and cooperative in NAD Head:  Normocephalic and atraumatic. Neck:  Supple; no masses or thyromegaly. Lungs:  Clear throughout to auscultation.    Heart:  Regular rate and rhythm. Abdomen:  Soft, nontender and nondistended. Normal bowel sounds, without guarding, and without rebound.   Neurologic:  Alert and  oriented x4;  grossly normal neurologically.  Impression/Plan: Yvette Rogers is here for an colonoscopy to be performed for [facility] of colon polyps.  08/22/2013 was the last colonoscopy and showed one small polyp.  Risks, benefits, limitations, and alternatives regarding  endoscopy have been reviewed with the patient.  Questions have been answered.  All parties agreeable.   Gaylyn Cheers, MD  10/13/2018, 1:07 PM

## 2018-10-13 NOTE — Anesthesia Preprocedure Evaluation (Signed)
Anesthesia Evaluation  Patient identified by MRN, date of birth, ID band Patient awake    Reviewed: Allergy & Precautions, H&P , NPO status , Patient's Chart, lab work & pertinent test results  History of Anesthesia Complications Negative for: history of anesthetic complications  Airway Mallampati: III  TM Distance: >3 FB Neck ROM: limited    Dental  (+) Chipped   Pulmonary neg pulmonary ROS, neg shortness of breath,           Cardiovascular Exercise Tolerance: Good (-) angina(-) Past MI negative cardio ROS       Neuro/Psych  Headaches, negative psych ROS   GI/Hepatic Neg liver ROS, GERD  Medicated and Controlled,  Endo/Other  negative endocrine ROS  Renal/GU negative Renal ROS  negative genitourinary   Musculoskeletal  (+) Arthritis ,   Abdominal   Peds  Hematology negative hematology ROS (+)   Anesthesia Other Findings Past Medical History: No date: Abnormal EKG No date: Anxiety No date: Arthritis No date: Cancer ([place])     Comment:  skin ca No date: Chest pain No date: Depression No date: Shingles  Past Surgical History: No date: BUNIONECTOMY; Right No date: CHOLECYSTECTOMY No date: COLONOSCOPY No date: EYE SURGERY; Bilateral     Comment:  Cataract removed No date: FOOT SURGERY No date: GALLBLADDER SURGERY No date: SKIN CANCER EXCISION     Comment:  face 08/22/2013: SKIN CANCER EXCISION; Left     Comment:  left nostril area No date: subconjunctival     Comment:  subconjunctival hemorrhage  BMI    Body Mass Index:  29.57 kg/m      Reproductive/Obstetrics negative OB ROS                             Anesthesia Physical Anesthesia Plan  ASA: III  Anesthesia Plan: General   Post-op Pain Management:    Induction: Intravenous  PONV Risk Score and Plan: Propofol infusion and TIVA  Airway Management Planned: Natural Airway and Nasal Cannula  Additional  Equipment:   Intra-op Plan:   Post-operative Plan:   Informed Consent: I have reviewed the patients History and Physical, chart, labs and discussed the procedure including the risks, benefits and alternatives for the proposed anesthesia with the patient or authorized representative who has indicated his/her understanding and acceptance.     Dental Advisory Given  Plan Discussed with: Anesthesiologist, CRNA and Surgeon  Anesthesia Plan Comments: (Patient consented for risks of anesthesia including but not limited to:  - adverse reactions to medications - risk of intubation if required - damage to teeth, lips or other oral mucosa - sore throat or hoarseness - Damage to heart, brain, lungs or loss of life  Patient voiced understanding.)        Anesthesia Quick Evaluation

## 2018-10-13 NOTE — Op Note (Signed)
[facility] Gastroenterology Patient Name: Yvette Rogers Procedure Date: 10/13/2018 1:17 PM MRN: [id number] Account #: [id number] Date of Birth: 01-May-1946 Admit Type: Outpatient Age: 74 Room: [facility] ROOM 3 Gender: Female Note Status: Finalized Procedure:            Colonoscopy Indications:          High risk colon cancer surveillance: Personal history                        of colonic polyps Providers:            Manya Silvas, MD Referring MD:         Janine Ores. Rosanna Randy, MD (Referring MD) Medicines:            Propofol per Anesthesia Complications:        No immediate complications. Procedure:            Pre-Anesthesia Assessment:                       - After reviewing the risks and benefits, the patient                        was deemed in satisfactory condition to undergo the                        procedure.                       After obtaining informed consent, the colonoscope was                        passed under direct vision. Throughout the procedure,                        the patient's blood pressure, pulse, and oxygen                        saturations were monitored continuously. The                        Colonoscope was introduced through the anus and                        advanced to the the cecum, identified by appendiceal                        orifice and ileocecal valve. The colonoscopy was                        performed without difficulty. The patient tolerated the                        procedure well. The quality of the bowel preparation                        was excellent. Findings:      A diminutive polyp was found in the descending colon. The polyp was       sessile. The polyp was removed with a hot snare. Resection and retrieval       were complete.      Internal hemorrhoids were found during  endoscopy. The hemorrhoids were       small and Grade I (internal hemorrhoids that do not prolapse).      The exam was otherwise  without abnormality. Impression:           - One diminutive polyp in the descending colon, removed                        with a hot snare. Resected and retrieved.                       - Internal hemorrhoids.                       - The examination was otherwise normal. Recommendation:       - Await pathology results. Manya Silvas, MD 10/13/2018 1:49:31 PM This report has been signed electronically. Number of Addenda: 0 Note Initiated On: 10/13/2018 1:17 PM Scope Withdrawal Time: 0 hours 13 minutes 20 seconds  Total Procedure Duration: 0 hours 22 minutes 28 seconds       [facility]

## 2018-10-14 ENCOUNTER — Encounter: Payer: Self-pay | Admitting: Unknown Physician Specialty

## 2018-10-15 LAB — SURGICAL PATHOLOGY

## 2018-10-27 ENCOUNTER — Encounter: Payer: Self-pay | Admitting: Physician Assistant

## 2018-10-27 ENCOUNTER — Ambulatory Visit (INDEPENDENT_AMBULATORY_CARE_PROVIDER_SITE_OTHER): Payer: PPO | Admitting: Physician Assistant

## 2018-10-27 VITALS — BP 126/81 | HR 76 | Temp 98.5°F | Resp 16

## 2018-10-27 DIAGNOSIS — B9789 Other viral agents as the cause of diseases classified elsewhere: Secondary | ICD-10-CM

## 2018-10-27 DIAGNOSIS — J040 Acute laryngitis: Secondary | ICD-10-CM | POA: Diagnosis not present

## 2018-10-27 DIAGNOSIS — J069 Acute upper respiratory infection, unspecified: Secondary | ICD-10-CM

## 2018-10-27 MED ORDER — PROMETHAZINE-DM 6.25-15 MG/5ML PO SYRP: 5.0000 mL | ORAL_SOLUTION | Freq: Every evening | ORAL | 0 refills | Status: DC | PRN

## 2018-10-27 NOTE — Progress Notes (Signed)
Patient: Yvette Rogers Female    DOB: 23-Aug-1946   73 y.o.   MRN: [id number] Visit Date: 10/28/2018  Today's Provider: Trinna Post, PA-C   Chief Complaint  Patient presents with  . URI   Subjective:     HPI Upper Respiratory Infection: Patient complains of symptoms of a URI. Symptoms include bilateral ear pain, congestion, cough, irritability and sore throat. Onset of symptoms was 4 days ago, unchanged since that time. She also c/o congestion, cough described as nonproductive and sinus pressure for the past 4 days .  She is drinking plenty of fluids. Evaluation to date: none. Treatment to date: cough suppressants and decongestants.    Allergies  Allergen Reactions  . Bactrim [Sulfamethoxazole-Trimethoprim] Swelling  . Penicillin V Potassium Hives  . Penicillins Hives     Current Outpatient Medications:  .  azelastine (OPTIVAR) 0.05 % ophthalmic solution, Place 1 drop into both eyes 2 (two) times daily. , Disp: , Rfl: 4 .  buPROPion (WELLBUTRIN XL) 300 MG 24 hr tablet, Take 1 tablet (300 mg total) by mouth daily., Disp: 90 tablet, Rfl: 3 .  clonazePAM (KLONOPIN) 1 MG tablet, Take 0.5-1 tablets (0.5-1 mg total) by mouth 2 (two) times daily as needed for anxiety., Disp: 60 tablet, Rfl: 0 .  meloxicam (MOBIC) 15 MG tablet, TAKE 1 TABLET BY MOUTH ONCE DAILY IF NEEDED FOR PAIN, Disp: 90 tablet, Rfl: 0 .  omeprazole (PRILOSEC) 20 MG capsule, Take 1 capsule (20 mg total) by mouth daily., Disp: 30 capsule, Rfl: 3 .  traZODone (DESYREL) 150 MG tablet, Take 1 tablet (150 mg total) by mouth at bedtime., Disp: 90 tablet, Rfl: 3 .  promethazine-dextromethorphan (PROMETHAZINE-DM) 6.25-15 MG/5ML syrup, Take 5 mLs by mouth at bedtime as needed., Disp: 118 mL, Rfl: 0  Review of Systems  Constitutional: Positive for appetite change.  HENT: Positive for congestion, ear pain, sinus pressure, sinus pain and sore throat.   Respiratory: Positive for cough.   Neurological: Positive for  headaches.    Social History   Tobacco Use  . Smoking status: Never Smoker  . Smokeless tobacco: Never Used  Substance Use Topics  . Alcohol use: Yes    Comment: rarely      Objective:   BP 126/81 (BP Location: Left Arm, Patient Position: Sitting, Cuff Size: Normal)   Pulse 76   Temp 98.5 F (36.9 C) (Oral)   Resp 16   LMP  (LMP Unknown)   SpO2 97%  Vitals:   10/27/18 1626  BP: 126/81  Pulse: 76  Resp: 16  Temp: 98.5 F (36.9 C)  TempSrc: Oral  SpO2: 97%     Physical Exam Constitutional:      Appearance: Normal appearance. She is well-developed. She is not ill-appearing.  HENT:     Right Ear: Tympanic membrane, ear canal and external ear normal.     Left Ear: Tympanic membrane, ear canal and external ear normal.     Mouth/Throat:     Mouth: Mucous membranes are moist.     Pharynx: Oropharynx is clear. No oropharyngeal exudate.  Eyes:     General:        Right eye: Discharge present.        Left eye: Discharge present. Neck:     Musculoskeletal: Neck supple.  Cardiovascular:     Rate and Rhythm: Normal rate and regular rhythm.     Heart sounds: Normal heart sounds.  Pulmonary:     Effort:  Pulmonary effort is normal. No respiratory distress.     Breath sounds: Normal breath sounds. No rales.  Lymphadenopathy:     Cervical: No cervical adenopathy.  Skin:    General: Skin is warm and dry.  Neurological:     Mental Status: She is alert and oriented to person, place, and time. Mental status is at baseline.  Psychiatric:        Mood and Affect: Mood normal.        Behavior: Behavior normal.         Assessment & Plan    1. Viral URI with cough  Counseled regarding signs and symptoms of viral and bacterial respiratory infections. Advised to call or return for additional evaluation if she develops any sign of bacterial infection, or if current symptoms last longer than 10 days.    - promethazine-dextromethorphan (PROMETHAZINE-DM) 6.25-15 MG/5ML syrup;  Take 5 mLs by mouth at bedtime as needed.  Dispense: 118 mL; Refill: 0  2. Laryngitis  - promethazine-dextromethorphan (PROMETHAZINE-DM) 6.25-15 MG/5ML syrup; Take 5 mLs by mouth at bedtime as needed.  Dispense: 118 mL; Refill: 0  The entirety of the information documented in the History of Present Illness, Review of Systems and Physical Exam were personally obtained by me. Portions of this information were initially documented by Lynford Humphrey, CMA and reviewed by me for thoroughness and accuracy.   Return if symptoms worsen or fail to improve.      Trinna Post, PA-C  [facility]

## 2018-10-27 NOTE — Patient Instructions (Signed)
Laryngitis  Laryngitis is irritation and swelling (inflammation) of your vocal cords. This condition causes symptoms such as:  · A change in your voice. It may sound low and hoarse.  · Loss of voice.  · Coughing.  · Sore throat.  · Dry throat.  · Stuffy nose.  Depending on the cause, this condition may go away after a short time or may last for more than 3 weeks. Treatment often involves resting your voice and using medicines to soothe your throat.  Follow these instructions at home:  Medicines  · Take over-the-counter and prescription medicines only as told by your doctor.  · If you were prescribed an antibiotic medicine, take it as told by your doctor. Do not stop taking it even if you start to feel better.  General instructions  · Talk as little as possible. Also avoid whispering.  · Write instead of talking. Do this until your voice is back to normal.  · Drink enough fluid to keep your pee (urine) pale yellow.  · Breathe in moist air. Use a humidifier if you live in a dry climate.  · Do not use any products that have nicotine or tobacco in them, such as cigarettes and e-cigarettes. If you need help quitting, ask your doctor.  Contact a doctor if:  · You have a fever.  · Your pain is worse.  · Your symptoms do not get better in 2 weeks.  Get help right away if:  · You cough up blood.  · You have trouble swallowing.  · You have trouble breathing.  Summary  · Laryngitis is inflammation of your vocal cords.  · This condition causes your voice to sound low and hoarse.  · Rest your voice by talking as little as possible. Also avoid whispering.  This information is not intended to replace advice given to you by your health care provider. Make sure you discuss any questions you have with your health care provider.  Document Released: 08/21/2011 Document Revised: 08/19/2017 Document Reviewed: 08/19/2017  Elsevier Interactive Patient Education © 2019 [employer]

## 2018-11-20 ENCOUNTER — Encounter: Payer: Self-pay | Admitting: Family Medicine

## 2018-11-20 ENCOUNTER — Ambulatory Visit (INDEPENDENT_AMBULATORY_CARE_PROVIDER_SITE_OTHER): Payer: PPO | Admitting: Family Medicine

## 2018-11-20 VITALS — BP 112/66 | HR 58 | Temp 98.0°F

## 2018-11-20 DIAGNOSIS — J069 Acute upper respiratory infection, unspecified: Secondary | ICD-10-CM | POA: Diagnosis not present

## 2018-11-20 MED ORDER — DOXYCYCLINE HYCLATE 100 MG PO TABS: 100.0000 mg | ORAL_TABLET | Freq: Two times a day (BID) | ORAL | 0 refills | Status: DC

## 2018-11-20 MED ORDER — BENZONATATE 200 MG PO CAPS: 200.0000 mg | ORAL_CAPSULE | Freq: Two times a day (BID) | ORAL | 0 refills | Status: DC | PRN

## 2018-11-20 NOTE — Progress Notes (Signed)
Patient: Yvette Rogers Female    DOB: 1945-10-15   73 y.o.   MRN: [id number] Visit Date: 11/20/2018  Today's Provider: Vernie Murders, PA   Chief Complaint  Patient presents with  . URI   Subjective:     URI   This is a new problem. Episode onset: 1 month ago. The problem has been unchanged. There has been no fever. Associated symptoms include congestion, coughing, ear pain, rhinorrhea, sinus pain and a sore throat. Associated symptoms comments: Hoarseness and post nasal drainage . Treatments tried: Tylenol, Motrin and OTC cold medications. The treatment provided no relief.   Past Medical History:  Diagnosis Date  . Abnormal EKG   . Anxiety   . Arthritis   . Cancer ([place])    skin ca  . Chest pain   . Depression   . Shingles    Past Surgical History:  Procedure Laterality Date  . BUNIONECTOMY Right   . CHOLECYSTECTOMY    . COLONOSCOPY    . COLONOSCOPY WITH PROPOFOL N/A 10/13/2018   Procedure: COLONOSCOPY WITH PROPOFOL;  Surgeon: Manya Silvas, MD;  Location: [facility] ENDOSCOPY;  Service: Endoscopy;  Laterality: N/A;  . EYE SURGERY Bilateral    Cataract removed  . FOOT SURGERY    . GALLBLADDER SURGERY    . SKIN CANCER EXCISION     face  . SKIN CANCER EXCISION Left 08/22/2013   left nostril area  . subconjunctival     subconjunctival hemorrhage   Family History  Problem Relation Age of Onset  . Cancer Mother        pancreatic  . Diabetes Mother   . Kidney Stones Mother   . COPD Father   . Diabetes Father   . Macular degeneration Father   . Breast cancer Sister 77  . Cancer Brother        Leukemia  . Breast cancer Cousin        mat cousin  ' Allergies  Allergen Reactions  . Bactrim [Sulfamethoxazole-Trimethoprim] Swelling  . Penicillin V Potassium Hives  . Penicillins Hives    Current Outpatient Medications:  .  azelastine (OPTIVAR) 0.05 % ophthalmic solution, Place 1 drop into both eyes 2 (two) times daily. , Disp: , Rfl: 4 .  buPROPion  (WELLBUTRIN XL) 300 MG 24 hr tablet, Take 1 tablet (300 mg total) by mouth daily., Disp: 90 tablet, Rfl: 3 .  clonazePAM (KLONOPIN) 1 MG tablet, Take 0.5-1 tablets (0.5-1 mg total) by mouth 2 (two) times daily as needed for anxiety., Disp: 60 tablet, Rfl: 0 .  meloxicam (MOBIC) 15 MG tablet, TAKE 1 TABLET BY MOUTH ONCE DAILY IF NEEDED FOR PAIN, Disp: 90 tablet, Rfl: 0 .  omeprazole (PRILOSEC) 20 MG capsule, Take 1 capsule (20 mg total) by mouth daily., Disp: 30 capsule, Rfl: 3 .  promethazine-dextromethorphan (PROMETHAZINE-DM) 6.25-15 MG/5ML syrup, Take 5 mLs by mouth at bedtime as needed., Disp: 118 mL, Rfl: 0 .  traZODone (DESYREL) 150 MG tablet, Take 1 tablet (150 mg total) by mouth at bedtime., Disp: 90 tablet, Rfl: 3  Review of Systems  Constitutional: Negative.   HENT: Positive for congestion, ear pain, postnasal drip, rhinorrhea, sinus pain, sore throat and voice change.   Respiratory: Positive for cough.   Cardiovascular: Negative.   Musculoskeletal: Negative.    Social History   Tobacco Use  . Smoking status: Never Smoker  . Smokeless tobacco: Never Used  Substance Use Topics  . Alcohol use: Yes  Comment: rarely     Objective:   LMP  (LMP Unknown)  Vitals:   11/20/18 1051  BP: 112/66  Pulse: (!) 58  Temp: 98 F (36.7 C)  TempSrc: Oral  SpO2: 99%   Physical Exam Constitutional:      General: She is not in acute distress.    Appearance: She is well-developed.  HENT:     Head: Normocephalic and atraumatic.     Right Ear: Hearing normal.     Left Ear: Hearing normal.     Nose: Nose normal.  Eyes:     General: Lids are normal. No scleral icterus.       Right eye: No discharge.        Left eye: No discharge.     Conjunctiva/sclera: Conjunctivae normal.  Pulmonary:     Effort: Pulmonary effort is normal. No respiratory distress.  Musculoskeletal: Normal range of motion.  Skin:    Findings: No lesion or rash.  Neurological:     Mental Status: She is alert and  oriented to person, place, and time.  Psychiatric:        Speech: Speech normal.        Behavior: Behavior normal.        Thought Content: Thought content normal.       Assessment & Plan    1. URI with cough and congestion Has had persistent cough and congestion over the past 3-4 weeks. No fever of significance or distant travel. Sputum has been clear recently but laryngitis worse with cough in the early morning or at night. No wheezing or rales today. Will treat with Doxycycline, Tessalon for night time cough and Mucinex-DM for day time cough. Add Flonase Nasal Spray for stopped up ear sensation with rhinorrhea. Recheck if no better in 5-7 days. May have an allergy component to symptoms. - doxycycline (VIBRA-TABS) 100 MG tablet; Take 1 tablet (100 mg total) by mouth 2 (two) times daily.  Dispense: 20 tablet; Refill: 0 - benzonatate (TESSALON) 200 MG capsule; Take 1 capsule (200 mg total) by mouth 2 (two) times daily as needed for cough.  Dispense: 20 capsule; Refill: [place], PA  [facility]

## 2018-11-25 ENCOUNTER — Ambulatory Visit (INDEPENDENT_AMBULATORY_CARE_PROVIDER_SITE_OTHER): Payer: PPO | Admitting: Family Medicine

## 2018-11-25 ENCOUNTER — Other Ambulatory Visit: Payer: Self-pay

## 2018-11-25 VITALS — BP 124/72 | HR 76 | Temp 98.1°F | Resp 16

## 2018-11-25 DIAGNOSIS — F419 Anxiety disorder, unspecified: Secondary | ICD-10-CM

## 2018-11-25 DIAGNOSIS — F329 Major depressive disorder, single episode, unspecified: Secondary | ICD-10-CM | POA: Diagnosis not present

## 2018-11-25 MED ORDER — CLONAZEPAM 1 MG PO TABS: 0.5000 mg | ORAL_TABLET | Freq: Two times a day (BID) | ORAL | 0 refills | Status: DC | PRN

## 2018-11-25 MED ORDER — SERTRALINE HCL 50 MG PO TABS: 50.0000 mg | ORAL_TABLET | Freq: Every day | ORAL | 3 refills | Status: DC
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
## 2019-01-06 ENCOUNTER — Ambulatory Visit: Payer: Self-pay | Admitting: Family Medicine

## 2019-01-21 ENCOUNTER — Ambulatory Visit: Payer: Self-pay

## 2019-01-31 ENCOUNTER — Ambulatory Visit (INDEPENDENT_AMBULATORY_CARE_PROVIDER_SITE_OTHER): Payer: PPO

## 2019-01-31 ENCOUNTER — Other Ambulatory Visit: Payer: Self-pay

## 2019-01-31 DIAGNOSIS — Z13820 Encounter for screening for osteoporosis: Secondary | ICD-10-CM | POA: Diagnosis not present

## 2019-01-31 DIAGNOSIS — Z0000 Encounter for general adult medical examination without abnormal findings: Secondary | ICD-10-CM

## 2019-01-31 NOTE — Progress Notes (Signed)
Subjective:   Yvette Rogers is a 73 y.o. female who presents for Medicare Annual (Subsequent) preventive examination.    This visit is being conducted through telemedicine due to the COVID-19 pandemic. This patient has given me verbal consent via doximity to conduct this visit, patient states they are participating from their home address. Some vital signs may be absent or patient reported.    Patient identification: identified by name, DOB, and current address  Review of Systems:  N/A  Cardiac Risk Factors include: advanced age (>86men, >69 women)     Objective:     Vitals: LMP  (LMP Unknown)   There is no height or weight on file to calculate BMI. Unable to obtain vitals due to visit being conducted via telephonically.  Advanced Directives 01/31/2019 10/13/2018 01/14/2018 01/10/2018 09/25/2016 05/18/2016  Does Patient Have a Medical Advance Directive? No No No No Yes No  Copy of Healthcare Power of Attorney in Chart? - No - copy requested - - - -  Would patient like information on creating a medical advance directive? Yes (ED - Information included in AVS) - Yes (MAU/Ambulatory/Procedural Areas - Information given) No - Patient declined - No - patient declined information    Tobacco Social History   Tobacco Use  Smoking Status Never Smoker  Smokeless Tobacco Never Used     Counseling given: Not Answered   Clinical Intake:  Pre-visit preparation completed: Yes  Pain : No/denies pain     Nutritional Risks: None Diabetes: No  How often do you need to have someone help you when you read instructions, pamphlets, or other written materials from your doctor or pharmacy?: 1 - Never  Interpreter Needed?: No  Information entered by :: [facility], LPN  Past Medical History:  Diagnosis Date  . Abnormal EKG   . Anxiety   . Arthritis   . Cancer ([place])    skin ca  . Chest pain   . Depression   . Shingles    Past Surgical History:  Procedure Laterality Date  .  BUNIONECTOMY Right   . CHOLECYSTECTOMY    . COLONOSCOPY    . COLONOSCOPY WITH PROPOFOL N/A 10/13/2018   Procedure: COLONOSCOPY WITH PROPOFOL;  Surgeon: Manya Silvas, MD;  Location: [facility] ENDOSCOPY;  Service: Endoscopy;  Laterality: N/A;  . EYE SURGERY Bilateral    Cataract removed  . FOOT SURGERY    . GALLBLADDER SURGERY    . SKIN CANCER EXCISION     face  . SKIN CANCER EXCISION Left 08/22/2013   left nostril area  . subconjunctival     subconjunctival hemorrhage   Family History  Problem Relation Age of Onset  . Cancer Mother        pancreatic  . Diabetes Mother   . Kidney Stones Mother   . COPD Father   . Diabetes Father   . Macular degeneration Father   . Breast cancer Sister 62  . Cancer Brother        Leukemia  . Breast cancer Cousin        mat cousin   Social History   Socioeconomic History  . Marital status: Married    Spouse name: Not on file  . Number of children: 1  . Years of education: Not on file  . Highest education level: 12th grade  Occupational History  . Occupation: Public librarian / clerical  Social Needs  . Financial resource strain: Not hard at all  . Food insecurity:    Worry:  Never true    Inability: Never true  . Transportation needs:    Medical: No    Non-medical: No  Tobacco Use  . Smoking status: Never Smoker  . Smokeless tobacco: Never Used  Substance and Sexual Activity  . Alcohol use: Yes    Comment: rarely  . Drug use: No  . Sexual activity: Not on file  Lifestyle  . Physical activity:    Days per week: 0 days    Minutes per session: 0 min  . Stress: Very much  Relationships  . Social connections:    Talks on phone: Patient refused    Gets together: Patient refused    Attends religious service: Patient refused    Active member of club or organization: Patient refused    Attends meetings of clubs or organizations: Patient refused    Relationship status: Patient refused  Other Topics Concern  . Not on file   Social History Narrative  . Not on file    Outpatient Encounter Medications as of 01/31/2019  Medication Sig  . azelastine (OPTIVAR) 0.05 % ophthalmic solution Place 1 drop into the left eye daily.   . benzonatate (TESSALON) 200 MG capsule Take 1 capsule (200 mg total) by mouth 2 (two) times daily as needed for cough.  . clonazePAM (KLONOPIN) 1 MG tablet Take 0.5-1 tablets (0.5-1 mg total) by mouth 2 (two) times daily as needed for anxiety.  . meloxicam (MOBIC) 15 MG tablet TAKE 1 TABLET BY MOUTH ONCE DAILY IF NEEDED FOR PAIN  . omeprazole (PRILOSEC) 20 MG capsule Take 1 capsule (20 mg total) by mouth daily. (Patient taking differently: Take 20 mg by mouth daily. As needed)  . sertraline (ZOLOFT) 50 MG tablet Take 1 tablet (50 mg total) by mouth daily.  . traZODone (DESYREL) 150 MG tablet Take 1 tablet (150 mg total) by mouth at bedtime.  Marland Kitchen buPROPion (WELLBUTRIN XL) 300 MG 24 hr tablet Take 1 tablet (300 mg total) by mouth daily. (Patient not taking: Reported on 01/31/2019)  . doxycycline (VIBRA-TABS) 100 MG tablet Take 1 tablet (100 mg total) by mouth 2 (two) times daily. (Patient not taking: Reported on 01/31/2019)  . promethazine-dextromethorphan (PROMETHAZINE-DM) 6.25-15 MG/5ML syrup Take 5 mLs by mouth at bedtime as needed. (Patient not taking: Reported on 01/31/2019)   No facility-administered encounter medications on file as of 01/31/2019.     Activities of Daily Living In your present state of health, do you have any difficulty performing the following activities: 01/31/2019  Hearing? N  Vision? N  Difficulty concentrating or making decisions? Y  Walking or climbing stairs? N  Dressing or bathing? N  Doing errands, shopping? N  Preparing Food and eating ? N  Using the Toilet? N  In the past six months, have you accidently leaked urine? N  Do you have problems with loss of bowel control? N  Managing your Medications? N  Managing your Finances? N  Housekeeping or managing your  Housekeeping? N  Some recent data might be hidden    Patient Care Team: Jerrol Banana., MD as PCP - General (Family Medicine)    Assessment:   This is a routine wellness examination for Yvette Rogers.  Exercise Activities and Dietary recommendations Current Exercise Habits: Home exercise routine, Type of exercise: walking, Time (Minutes): 25, Frequency (Times/Week): 3, Weekly Exercise (Minutes/Week): 75, Intensity: Mild, Exercise limited by: None identified  Goals    . DIET - INCREASE WATER INTAKE     Recommend to drink at  least 6-8 8oz glasses of water per day.    . Exercise 3x per week (30 min per time)     Recommend to start exercising 3 days a week for at least 30 minutes at a time.    Marland Kitchen LIFESTYLE - DECREASE FALLS RISK     Recommend to remove any items from the home that may cause slips or trips.       Fall Risk: Fall Risk  01/31/2019 01/14/2018 11/17/2017 09/25/2016 12/06/2015  Falls in the past year? 1 No No No No  Number falls in past yr: 0 - - - -  Injury with Fall? 0 - - - -  Follow up Falls prevention discussed - - - -    FALL RISK PREVENTION PERTAINING TO THE HOME:  Any stairs in or around the home? No  If so, are there any without handrails? N/A  Home free of loose throw rugs in walkways, pet beds, electrical cords, etc? Yes  Adequate lighting in your home to reduce risk of falls? Yes   ASSISTIVE DEVICES UTILIZED TO PREVENT FALLS:  Life alert? No  Use of a cane, walker or w/c? No  Grab bars in the bathroom? No  Shower chair or bench in shower? No  Elevated toilet seat or a handicapped toilet? No    TIMED UP AND GO:  Was the test performed? No .    Depression Screen PHQ 2/9 Scores 01/31/2019 01/14/2018 11/17/2017 03/26/2017  PHQ - 2 Score 4 4 3 4   PHQ- 9 Score 19 14 11 14      Cognitive Function     6CIT Screen 09/25/2016  What Year? 0 points  What month? 0 points  What time? 0 points  Count back from 20 0 points  Months in reverse 0 points  Repeat  phrase 2 points  Total Score 2    Immunization History  Administered Date(s) Administered  . Influenza, Seasonal, Injecte, Preservative Fre 07/10/2016  . Influenza,inj,Quad PF,6+ Mos 07/03/2017, 06/10/2018  . PPD Test 12/11/2015, 12/25/2015  . Pneumococcal Conjugate-13 09/06/2015  . Pneumococcal Polysaccharide-23 09/25/2016  . Tdap 02/28/2013  . Zoster 02/28/2013    Qualifies for Shingles Vaccine? Yes  Zostavax completed 02/28/13. Due for Shingrix. Education has been provided regarding the importance of this vaccine. Pt has been advised to call insurance company to determine out of pocket expense. Advised may also receive vaccine at local pharmacy or Health Dept. Verbalized acceptance and understanding.  Tdap: Up to date  Flu Vaccine: Up to date  Pneumococcal Vaccine: Up to date  Screening Tests Health Maintenance  Topic Date Due  . DEXA SCAN  07/29/2013  . INFLUENZA VACCINE  04/16/2019  . MAMMOGRAM  10/22/2019  . TETANUS/TDAP  03/01/2023  . COLONOSCOPY  10/14/2023  . Hepatitis C Screening  Completed  . PNA vac Low Risk Adult  Completed    Cancer Screenings:  Colorectal Screening: Completed 10/13/18. Repeat every 5 years..  Mammogram: Up to date  Bone Density: Ordered today. Pt provided with contact info and advised to call to schedule appt. Pt aware the office will call re: appt.  Lung Cancer Screening: (Low Dose CT Chest recommended if Age 83-80 years, 30 pack-year currently smoking OR have quit w/in 15years.) does not qualify.   Additional Screening:  Hepatitis C Screening: Up to date  Vision Screening: Recommended annual ophthalmology exams for early detection of glaucoma and other disorders of the eye.  Dental Screening: Recommended annual dental exams for proper oral hygiene  Community  Resource Referral:  CRR required this visit?  No       Plan:  I have personally reviewed and addressed the Medicare Annual Wellness questionnaire and have noted the  following in the patient's chart:  A. Medical and social history B. Use of alcohol, tobacco or illicit drugs  C. Current medications and supplements D. Functional ability and status E.  Nutritional status F.  Physical activity G. Advance directives H. List of other physicians I.  Hospitalizations, surgeries, and ER visits in previous 12 months J.  [place] such as hearing and vision if needed, cognitive and depression L. Referrals and appointments   In addition, I have reviewed and discussed with patient certain preventive protocols, quality metrics, and best practice recommendations. A written personalized care plan for preventive services as well as general preventive health recommendations were provided to patient. Nurse Health Advisor  Signed,    Mavin Dyke [place], [place]  9/96/9249 Nurse Health Advisor   Nurse Notes: None.

## 2019-01-31 NOTE — Patient Instructions (Addendum)
[name] , Thank you for taking time to come for your Medicare Wellness Visit. I appreciate your ongoing commitment to your health goals. Please review the following plan we discussed and let me know if I can assist you in the future.   Screening recommendations/referrals: Colonoscopy: Up to date, due 09/2023 Mammogram: 10/2019 Bone Density: Ordered today. Pt provided with contact info and advised to call to schedule appt Recommended yearly ophthalmology/optometry visit for glaucoma screening and checkup Recommended yearly dental visit for hygiene and checkup  Vaccinations: Influenza vaccine: Up to date Pneumococcal vaccine: Completed series Tdap vaccine: Up to date, due 02/2023 Shingles vaccine: Pt declines today.     Advanced directives: Advance directive discussed with you today. I have provided information to be able to complete forms at home and have notarized. Once this is complete please bring a copy in to our office so we can scan it into your chart.  Conditions/risks identified: 02/21/19 with Dr Rosanna Randy.   Next appointment: 02/21/19 with Dr Rosanna Randy.    Preventive Care 37 Years and Older, Female Preventive care refers to lifestyle choices and visits with your health care provider that can promote health and wellness. What does preventive care include?  A yearly physical exam. This is also called an annual well check.  Dental exams once or twice a year.  Routine eye exams. Ask your health care provider how often you should have your eyes checked.  Personal lifestyle choices, including:  Daily care of your teeth and gums.  Regular physical activity.  Eating a healthy diet.  Avoiding tobacco and drug use.  Limiting alcohol use.  Practicing safe sex.  Taking low-dose aspirin every day.  Taking vitamin and mineral supplements as recommended by your health care provider. What happens during an annual well check? The services and screenings done by your health  care provider during your annual well check will depend on your age, overall health, lifestyle risk factors, and family history of disease. Counseling  Your health care provider may ask you questions about your:  Alcohol use.  Tobacco use.  Drug use.  Emotional well-being.  Home and relationship well-being.  Sexual activity.  Eating habits.  History of falls.  Memory and ability to understand (cognition).  Work and work Statistician.  Reproductive health. Screening  You may have the following tests or measurements:  Height, weight, and BMI.  Blood pressure.  Lipid and cholesterol levels. These may be checked every 5 years, or more frequently if you are over 71 years old.  Skin check.  Lung cancer screening. You may have this screening every year starting at 71 years old if you have a 30-pack-year history of smoking and currently smoke or have quit within the past 15 years.  Fecal occult blood test (FOBT) of the stool. You may have this test every year starting at 71 years old.  Flexible sigmoidoscopy or colonoscopy. You may have a sigmoidoscopy every 5 years or a colonoscopy every 10 years starting at 71 years old.  Hepatitis C blood test.  Hepatitis B blood test.  Sexually transmitted disease (STD) testing.  Diabetes screening. This is done by checking your blood sugar (glucose) after you have not eaten for a while (fasting). You may have this done every 1-3 years.  Bone density scan. This is done to screen for osteoporosis. You may have this done starting at 71 years old.  Mammogram. This may be done every 1-2 years. Talk to your health care provider about how often you should have regular mammograms.  Talk with your health care provider about your test results, treatment options, and if necessary, the need for more tests. Vaccines  Your health care provider may recommend certain vaccines, such as:  Influenza vaccine. This is recommended every year.  Tetanus, diphtheria, and  acellular pertussis (Tdap, Td) vaccine. You may need a Td booster every 10 years.  Zoster vaccine. You may need this after age 73.  Pneumococcal 13-valent conjugate (PCV13) vaccine. One dose is recommended after age 71.  Pneumococcal polysaccharide (PPSV23) vaccine. One dose is recommended after age 71. Talk to your health care provider about which screenings and vaccines you need and how often you need them. This information is not intended to replace advice given to you by your health care provider. Make sure you discuss any questions you have with your health care provider. Document Released: 09/28/2015 Document Revised: 05/21/2016 Document Reviewed: 07/03/2015 Elsevier Interactive Patient Education  2017 [place] Prevention in the Home Falls can cause injuries. They can happen to people of all ages. There are many things you can do to make your home safe and to help prevent falls. What can I do on the outside of my home?  Regularly fix the edges of walkways and driveways and fix any cracks.  Remove anything that might make you trip as you walk through a door, such as a raised step or threshold.  Trim any bushes or trees on the path to your home.  Use bright outdoor lighting.  Clear any walking paths of anything that might make someone trip, such as rocks or tools.  Regularly check to see if handrails are loose or broken. Make sure that both sides of any steps have handrails.  Any raised decks and porches should have guardrails on the edges.  Have any leaves, snow, or ice cleared regularly.  Use sand or salt on walking paths during winter.  Clean up any spills in your garage right away. This includes oil or grease spills. What can I do in the bathroom?  Use night lights.  Install grab bars by the toilet and in the tub and shower. Do not use towel bars as grab bars.  Use non-skid mats or decals in the tub or shower.  If you need to sit down in the shower, use  a plastic, non-slip stool.  Keep the floor dry. Clean up any water that spills on the floor as soon as it happens.  Remove soap buildup in the tub or shower regularly.  Attach bath mats securely with double-sided non-slip rug tape.  Do not have throw rugs and other things on the floor that can make you trip. What can I do in the bedroom?  Use night lights.  Make sure that you have a light by your bed that is easy to reach.  Do not use any sheets or blankets that are too big for your bed. They should not hang down onto the floor.  Have a firm chair that has side arms. You can use this for support while you get dressed.  Do not have throw rugs and other things on the floor that can make you trip. What can I do in the kitchen?  Clean up any spills right away.  Avoid walking on wet floors.  Keep items that you use a lot in easy-to-reach places.  If you need to reach something above you, use a strong step stool that has a grab bar.  Keep electrical cords out of the way.  Do not use floor polish or wax that makes floors slippery. If you must use wax, use non-skid floor wax.  Do not have throw rugs and other things on the floor that can make you trip. What can I do with my stairs?  Do not leave any items on the stairs.  Make sure that there are handrails on both sides of the stairs and use them. Fix handrails that are broken or loose. Make sure that handrails are as long as the stairways.  Check any carpeting to make sure that it is firmly attached to the stairs. Fix any carpet that is loose or worn.  Avoid having throw rugs at the top or bottom of the stairs. If you do have throw rugs, attach them to the floor with carpet tape.  Make sure that you have a light switch at the top of the stairs and the bottom of the stairs. If you do not have them, ask someone to add them for you. What else can I do to help prevent falls?  Wear shoes that:  Do not have high heels.  Have  rubber bottoms.  Are comfortable and fit you well.  Are closed at the toe. Do not wear sandals.  If you use a stepladder:  Make sure that it is fully opened. Do not climb a closed stepladder.  Make sure that both sides of the stepladder are locked into place.  Ask someone to hold it for you, if possible.  Clearly mark and make sure that you can see:  Any grab bars or handrails.  First and last steps.  Where the edge of each step is.  Use tools that help you move around (mobility aids) if they are needed. These include:  Canes.  Walkers.  Scooters.  Crutches.  Turn on the lights when you go into a dark area. Replace any light bulbs as soon as they burn out.  Set up your furniture so you have a clear path. Avoid moving your furniture around.  If any of your floors are uneven, fix them.  If there are any pets around you, be aware of where they are.  Review your medicines with your doctor. Some medicines can make you feel dizzy. This can increase your chance of falling. Ask your doctor what other things that you can do to help prevent falls. This information is not intended to replace advice given to you by your health care provider. Make sure you discuss any questions you have with your health care provider. Document Released: 06/28/2009 Document Revised: 02/07/2016 Document Reviewed: 10/06/2014 Elsevier Interactive Patient Education  2017 [employer].

## 2019-02-01 ENCOUNTER — Ambulatory Visit: Payer: Self-pay | Admitting: Family Medicine

## 2019-02-21 ENCOUNTER — Ambulatory Visit: Payer: Self-pay | Admitting: Family Medicine

## 2019-03-09 ENCOUNTER — Ambulatory Visit: Payer: PPO | Attending: Family Medicine

## 2019-03-16 ENCOUNTER — Ambulatory Visit: Payer: Self-pay | Admitting: Family Medicine

## 2019-03-22 ENCOUNTER — Ambulatory Visit (INDEPENDENT_AMBULATORY_CARE_PROVIDER_SITE_OTHER): Payer: PPO | Admitting: Family Medicine

## 2019-03-22 ENCOUNTER — Other Ambulatory Visit: Payer: Self-pay | Admitting: *Deleted

## 2019-03-22 ENCOUNTER — Other Ambulatory Visit: Payer: Self-pay

## 2019-03-22 ENCOUNTER — Encounter: Payer: Self-pay | Admitting: Family Medicine

## 2019-03-22 VITALS — BP 132/82 | HR 77 | Temp 98.0°F | Resp 16

## 2019-03-22 DIAGNOSIS — M25571 Pain in right ankle and joints of right foot: Secondary | ICD-10-CM

## 2019-03-22 DIAGNOSIS — F331 Major depressive disorder, recurrent, moderate: Secondary | ICD-10-CM

## 2019-03-22 DIAGNOSIS — F419 Anxiety disorder, unspecified: Secondary | ICD-10-CM

## 2019-03-22 DIAGNOSIS — K2900 Acute gastritis without bleeding: Secondary | ICD-10-CM | POA: Diagnosis not present

## 2019-03-22 DIAGNOSIS — J069 Acute upper respiratory infection, unspecified: Secondary | ICD-10-CM

## 2019-03-22 MED ORDER — SERTRALINE HCL 100 MG PO TABS: 100.0000 mg | ORAL_TABLET | Freq: Every day | ORAL | 3 refills | Status: DC

## 2019-03-22 MED ORDER — CLONAZEPAM 1 MG PO TABS: 0.5000 mg | ORAL_TABLET | Freq: Two times a day (BID) | ORAL | 0 refills | Status: DC | PRN

## 2019-03-22 MED ORDER — MELOXICAM 15 MG PO TABS: 15.0000 mg | ORAL_TABLET | Freq: Every day | ORAL | 0 refills | Status: DC

## 2019-03-22 MED ORDER — DOXYCYCLINE HYCLATE 100 MG PO TABS: 100.0000 mg | ORAL_TABLET | Freq: Two times a day (BID) | ORAL | 0 refills | Status: DC

## 2019-03-22 NOTE — Patient Instructions (Addendum)
1. GAD   -increase sertraline 50 mg daily to 100 mg daily. -sertraline (ZOLOFT) 100 MG tablet #30 x3 refills  2. Sinusitis  -doxycycline (VIBRA-TABS) 100 MG tablet bid #20 0 refills.

## 2019-03-22 NOTE — Progress Notes (Signed)
Patient: Yvette Rogers Female    DOB: 07/31/1946   73 y.o.   MRN: [id number] Visit Date: 03/22/2019  Today's Provider: [name], MD   Chief Complaint  Patient presents with  . Sinusitis   Subjective:     HPI   Patient has had sinus pressure and pain for 4 days. Patient also has symptoms of headache, ear pain, and sinus drainage. No fever.   Allergies  Allergen Reactions  . Bactrim [Sulfamethoxazole-Trimethoprim] Swelling  . Penicillin V Potassium Hives  . Penicillins Hives     Current Outpatient Medications:  .  azelastine (OPTIVAR) 0.05 % ophthalmic solution, Place 1 drop into the left eye daily. , Disp: , Rfl: 4 .  clonazePAM (KLONOPIN) 1 MG tablet, Take 0.5-1 tablets (0.5-1 mg total) by mouth 2 (two) times daily as needed for anxiety., Disp: 60 tablet, Rfl: 0 .  meloxicam (MOBIC) 15 MG tablet, TAKE 1 TABLET BY MOUTH ONCE DAILY IF NEEDED FOR PAIN, Disp: 90 tablet, Rfl: 0 .  sertraline (ZOLOFT) 50 MG tablet, Take 1 tablet (50 mg total) by mouth daily., Disp: 30 tablet, Rfl: 3 .  traZODone (DESYREL) 150 MG tablet, Take 1 tablet (150 mg total) by mouth at bedtime., Disp: 90 tablet, Rfl: 3 .  benzonatate (TESSALON) 200 MG capsule, Take 1 capsule (200 mg total) by mouth 2 (two) times daily as needed for cough. (Patient not taking: Reported on 03/22/2019), Disp: 20 capsule, Rfl: 0 .  buPROPion (WELLBUTRIN XL) 300 MG 24 hr tablet, Take 1 tablet (300 mg total) by mouth daily. (Patient not taking: Reported on 01/31/2019), Disp: 90 tablet, Rfl: 3 .  doxycycline (VIBRA-TABS) 100 MG tablet, Take 1 tablet (100 mg total) by mouth 2 (two) times daily. (Patient not taking: Reported on 01/31/2019), Disp: 20 tablet, Rfl: 0 .  omeprazole (PRILOSEC) 20 MG capsule, Take 1 capsule (20 mg total) by mouth daily. (Patient not taking: Reported on 03/22/2019), Disp: 30 capsule, Rfl: 3 .  promethazine-dextromethorphan (PROMETHAZINE-DM) 6.25-15 MG/5ML syrup, Take 5 mLs by mouth at bedtime as  needed. (Patient not taking: Reported on 01/31/2019), Disp: 118 mL, Rfl: 0  Review of Systems  Constitutional: Negative for appetite change, chills, fatigue and fever.  HENT: Positive for congestion, postnasal drip and sinus pressure.   Eyes: Negative.   Respiratory: Negative for chest tightness and shortness of breath.   Cardiovascular: Negative for chest pain and palpitations.  Gastrointestinal: Positive for abdominal pain. Negative for nausea and vomiting.  Endocrine: Negative.   Genitourinary: Negative.   Skin: Negative.   Neurological: Negative for dizziness and weakness.  Psychiatric/Behavioral: Positive for agitation.    Social History   Tobacco Use  . Smoking status: Never Smoker  . Smokeless tobacco: Never Used  Substance Use Topics  . Alcohol use: Yes    Comment: rarely      Objective:   BP 132/82 (BP Location: Left Arm, Patient Position: Sitting, Cuff Size: Large)   Pulse 77   Temp 98 F (36.7 C) (Oral)   Resp 16   LMP  (LMP Unknown)   SpO2 99%  Vitals:   03/22/19 1454  BP: 132/82  Pulse: 77  Resp: 16  Temp: 98 F (36.7 C)  TempSrc: Oral  SpO2: 99%     Physical Exam Vitals signs reviewed.  Constitutional:      Appearance: She is well-developed.  HENT:     Head: Normocephalic and atraumatic.     Comments: Frontal sinuses tender.  Right Ear: External ear normal.     Left Ear: External ear normal.     Mouth/Throat:     Pharynx: Oropharynx is clear.  Eyes:     General: No scleral icterus.    Conjunctiva/sclera: Conjunctivae normal.  Neck:     Thyroid: No thyromegaly.  Cardiovascular:     Rate and Rhythm: Normal rate and regular rhythm.     Heart sounds: Normal heart sounds.  Pulmonary:     Effort: Pulmonary effort is normal.     Breath sounds: Normal breath sounds.  Abdominal:     Palpations: Abdomen is soft.  Skin:    General: Skin is warm and dry.  Neurological:     General: No focal deficit present.     Mental Status: She is alert  and oriented to person, place, and time.  Psychiatric:        Mood and Affect: Mood normal.        Behavior: Behavior normal.        Thought Content: Thought content normal.        Judgment: Judgment normal.      No results found for any visits on 03/22/19.     Assessment & Plan    1. Anxiety Increase sertraline to 100mg . Avoid benzos.  2. Moderate episode of recurrent major depressive disorder ([place])   3. URI with cough and congestion  - doxycycline (VIBRA-TABS) 100 MG tablet; Take 1 tablet (100 mg total) by mouth 2 (two) times daily.  Dispense: 20 tablet; Refill: 0  4. Acute gastritis without hemorrhage, unspecified gastritis type Omeprazole daily--rtc 1 month  I,[name],acting as a scribe for [name], MD.,have documented all relevant documentation on the behalf of [name], MD,as directed by  [name], MD while in the presence of [name], MD.  I have done the exam and reviewed the chart and it is accurate to the best of my knowledge. Development worker, community has been used and  any errors in dictation or transcription are unintentional. Miguel Aschoff M.D. [place], MD  [facility]

## 2019-03-26 ENCOUNTER — Ambulatory Visit: Payer: Self-pay | Admitting: Family Medicine

## 2019-03-31 DIAGNOSIS — L821 Other seborrheic keratosis: Secondary | ICD-10-CM | POA: Diagnosis not present

## 2019-03-31 DIAGNOSIS — L570 Actinic keratosis: Secondary | ICD-10-CM | POA: Diagnosis not present

## 2019-04-26 ENCOUNTER — Ambulatory Visit: Payer: Self-pay | Admitting: Family Medicine

## 2019-05-11 ENCOUNTER — Other Ambulatory Visit: Payer: Self-pay | Admitting: Family Medicine

## 2019-05-11 DIAGNOSIS — G4709 Other insomnia: Secondary | ICD-10-CM

## 2019-06-24 ENCOUNTER — Other Ambulatory Visit: Payer: Self-pay | Admitting: Family Medicine

## 2019-06-24 DIAGNOSIS — M25571 Pain in right ankle and joints of right foot: Secondary | ICD-10-CM

## 2019-07-24 ENCOUNTER — Other Ambulatory Visit: Payer: Self-pay | Admitting: Family Medicine

## 2019-09-22 ENCOUNTER — Other Ambulatory Visit: Payer: Self-pay | Admitting: Family Medicine

## 2019-09-22 DIAGNOSIS — M25571 Pain in right ankle and joints of right foot: Secondary | ICD-10-CM

## 2020-01-19 DIAGNOSIS — L821 Other seborrheic keratosis: Secondary | ICD-10-CM | POA: Diagnosis not present

## 2020-01-19 DIAGNOSIS — I781 Nevus, non-neoplastic: Secondary | ICD-10-CM | POA: Diagnosis not present

## 2020-01-19 DIAGNOSIS — L570 Actinic keratosis: Secondary | ICD-10-CM | POA: Diagnosis not present

## 2020-02-01 ENCOUNTER — Encounter: Payer: Self-pay | Admitting: Physician Assistant

## 2020-02-01 ENCOUNTER — Other Ambulatory Visit: Payer: Self-pay

## 2020-02-01 ENCOUNTER — Telehealth: Payer: Self-pay

## 2020-02-01 ENCOUNTER — Ambulatory Visit
Admission: RE | Admit: 2020-02-01 | Discharge: 2020-02-01 | Disposition: A | Discharge status: Home / Self Care | Payer: PPO | Source: Ambulatory Visit | Attending: Physician Assistant | Admitting: Physician Assistant

## 2020-02-01 ENCOUNTER — Ambulatory Visit (INDEPENDENT_AMBULATORY_CARE_PROVIDER_SITE_OTHER): Payer: PPO | Admitting: Physician Assistant

## 2020-02-01 VITALS — BP 124/82 | HR 85 | Temp 96.8°F | Wt 184.8 lb

## 2020-02-01 DIAGNOSIS — M7989 Other specified soft tissue disorders: Secondary | ICD-10-CM | POA: Diagnosis not present

## 2020-02-01 DIAGNOSIS — F419 Anxiety disorder, unspecified: Secondary | ICD-10-CM | POA: Diagnosis not present

## 2020-02-01 NOTE — Patient Instructions (Signed)

## 2020-02-01 NOTE — Telephone Encounter (Signed)
Called patient and no answer left voicemail message for patient to return call. If patient calls back okay for PEC to advise of results.

## 2020-02-01 NOTE — Telephone Encounter (Signed)
-----   Message from Trinna Post, [place] sent at 02/01/2020  3:25 PM EDT ----- Ultrasound is negative for DVT.

## 2020-02-01 NOTE — Progress Notes (Signed)
Established patient visit   Patient: Yvette Rogers   DOB: 08/04/1946   74 y.o. Female  MRN: [id number] Visit Date: 02/01/2020  Today's healthcare provider: Trinna Post, PA-C   Chief Complaint  Patient presents with  . Leg Swelling   Subjective    HPI  Leg Swelling Patient presents today for lower right leg swelling for about 3-4 weeks now. She reports overall swelling ongoing for one year. Patient reports leg is aching and tingling. She packed ice pack and elevate leg. Patient reports a knot back of her right knee for about one year and never mention it to anyone. She reports knot is tender to touch. She states that she can barely bend the right knee.   Patient also mentions that she is having trouble relaxing. She was previously on zoloft, wellbutrin, klonopin and trazodone. She has not followed up with PCP regarding this since 11/2018. Reports difficulty sleeping and trazodone not working.      Medications: Outpatient Medications Prior to Visit  Medication Sig  . azelastine (OPTIVAR) 0.05 % ophthalmic solution Place 1 drop into the left eye daily.   . benzonatate (TESSALON) 200 MG capsule Take 1 capsule (200 mg total) by mouth 2 (two) times daily as needed for cough. (Patient not taking: Reported on 03/22/2019)  . buPROPion (WELLBUTRIN XL) 300 MG 24 hr tablet Take 1 tablet (300 mg total) by mouth daily. (Patient not taking: Reported on 01/31/2019)  . clonazePAM (KLONOPIN) 1 MG tablet Take 0.5-1 tablets (0.5-1 mg total) by mouth 2 (two) times daily as needed for anxiety.  Marland Kitchen doxycycline (VIBRA-TABS) 100 MG tablet Take 1 tablet (100 mg total) by mouth 2 (two) times daily.  . meloxicam (MOBIC) 15 MG tablet TAKE 1 TABLET(15 MG) BY MOUTH DAILY  . omeprazole (PRILOSEC) 20 MG capsule Take 1 capsule (20 mg total) by mouth daily. (Patient not taking: Reported on 03/22/2019)  . promethazine-dextromethorphan (PROMETHAZINE-DM) 6.25-15 MG/5ML syrup Take 5 mLs by mouth at bedtime as  needed. (Patient not taking: Reported on 01/31/2019)  . sertraline (ZOLOFT) 100 MG tablet TAKE 1 TABLET(100 MG) BY MOUTH DAILY  . traZODone (DESYREL) 150 MG tablet TAKE 1 TABLET(150 MG) BY MOUTH AT BEDTIME   No facility-administered medications prior to visit.    Review of Systems    Objective    BP 124/82 (BP Location: Left Arm, Patient Position: Sitting, Cuff Size: Normal)   Pulse 85   Temp (!) 96.8 F (36 C) (Temporal)   Wt 184 lb 12.8 oz (83.8 kg)   LMP  (LMP Unknown)   SpO2 96%   BMI 31.23 kg/m    Physical Exam Constitutional:      Appearance: Normal appearance.  Cardiovascular:     Rate and Rhythm: Normal rate and regular rhythm.     Pulses:          Dorsalis pedis pulses are 2+ on the right side and 2+ on the left side.       Posterior tibial pulses are 2+ on the right side and 2+ on the left side.     Heart sounds: Normal heart sounds.  Pulmonary:     Effort: Pulmonary effort is normal.     Breath sounds: Normal breath sounds.  Musculoskeletal:     Right knee: Swelling present. No effusion. Normal range of motion. No tenderness.     Left knee: Normal.     Right lower leg: Swelling present. Edema present.     Left lower  leg: No edema.       Legs:  Skin:    General: Skin is warm and dry.  Neurological:     General: No focal deficit present.     Mental Status: She is alert and oriented to person, place, and time.  Psychiatric:        Mood and Affect: Mood normal.        Behavior: Behavior normal.       No results found for any visits on 02/01/20.  Assessment & Plan    1. Leg swelling Patient has a 3 cm cyst on the back of right knee and a [place] was placed to rule out DVT. - US Venous Img Lower Unilateral Right (DVT); Future  2. Anxiety  Sleep may be function of her mood and this is a larger conversation than we have time for at this visit. Suggest she follow up with her PCP regarding medical management of this. Offered to schedule appointment for her  today, she declines.   No follow-ups on file.      ITrinna Post, PA-C, have reviewed all documentation for this visit. The documentation on 02/01/20 for the exam, diagnosis, procedures, and orders are all accurate and complete.    Paulene Floor  [facility] [phone] (phone) [phone] (fax)  [place]

## 2020-02-02 NOTE — Telephone Encounter (Signed)
Tried calling patient and no answer, will call patient at a later time.

## 2020-02-02 NOTE — Telephone Encounter (Signed)
If she is having a lot of joint pain, we can consider xraying her knee to assess that. Sometimes people can have swelling due to vascular issues as well and we do commonly refer to vascular surgery.

## 2020-02-02 NOTE — Telephone Encounter (Signed)
Patient returned call- notified : negative DVT. Patient wants to know if there are recommendations for next steps- what to do now. Advised would forward her question to provider.

## 2020-02-03 NOTE — Telephone Encounter (Signed)
Per patient she is not sure what she wants to do. She will call back to let us know.

## 2020-02-03 NOTE — Telephone Encounter (Signed)
She can follow up with her PCP regarding her final decision.

## 2020-03-15 DIAGNOSIS — H353131 Nonexudative age-related macular degeneration, bilateral, early dry stage: Secondary | ICD-10-CM | POA: Diagnosis not present

## 2020-03-15 DIAGNOSIS — H26493 Other secondary cataract, bilateral: Secondary | ICD-10-CM | POA: Diagnosis not present

## 2020-03-15 DIAGNOSIS — H26491 Other secondary cataract, right eye: Secondary | ICD-10-CM | POA: Diagnosis not present

## 2020-03-15 DIAGNOSIS — H35033 Hypertensive retinopathy, bilateral: Secondary | ICD-10-CM | POA: Diagnosis not present

## 2020-03-15 DIAGNOSIS — H1045 Other chronic allergic conjunctivitis: Secondary | ICD-10-CM | POA: Diagnosis not present

## 2020-04-05 DIAGNOSIS — H26492 Other secondary cataract, left eye: Secondary | ICD-10-CM | POA: Diagnosis not present

## 2020-04-10 ENCOUNTER — Other Ambulatory Visit: Payer: Self-pay | Admitting: Family Medicine

## 2020-04-10 DIAGNOSIS — M25571 Pain in right ankle and joints of right foot: Secondary | ICD-10-CM

## 2020-04-23 ENCOUNTER — Telehealth: Payer: Self-pay | Admitting: Family Medicine

## 2020-04-23 DIAGNOSIS — F419 Anxiety disorder, unspecified: Secondary | ICD-10-CM

## 2020-04-23 MED ORDER — CLONAZEPAM 1 MG PO TABS: 0.5000 mg | ORAL_TABLET | Freq: Two times a day (BID) | ORAL | 0 refills | Status: DC | PRN

## 2020-04-23 NOTE — Telephone Encounter (Signed)
Please advise 

## 2020-04-23 NOTE — Telephone Encounter (Signed)
Ok to rf clonazepam once.

## 2020-04-23 NOTE — Telephone Encounter (Signed)
Patient loss her spouse and would like PCP to prescribe anxiety medication, patient would like a follow up call today   [address], [address] Phone:  [phone]  Fax:  [phone]

## 2020-04-24 ENCOUNTER — Other Ambulatory Visit: Payer: Self-pay | Admitting: Family Medicine

## 2020-04-24 DIAGNOSIS — F419 Anxiety disorder, unspecified: Secondary | ICD-10-CM

## 2020-04-24 MED ORDER — CLONAZEPAM 1 MG PO TABS: 0.5000 mg | ORAL_TABLET | Freq: Two times a day (BID) | ORAL | 0 refills | Status: DC | PRN

## 2020-06-04 ENCOUNTER — Other Ambulatory Visit: Payer: Self-pay | Admitting: Family Medicine

## 2020-06-04 DIAGNOSIS — Z1231 Encounter for screening mammogram for malignant neoplasm of breast: Secondary | ICD-10-CM

## 2020-06-14 ENCOUNTER — Ambulatory Visit
Admission: RE | Admit: 2020-06-14 | Discharge: 2020-06-14 | Disposition: A | Discharge status: Home / Self Care | Payer: PPO | Source: Ambulatory Visit | Attending: Family Medicine | Admitting: Family Medicine

## 2020-06-14 ENCOUNTER — Other Ambulatory Visit: Payer: Self-pay

## 2020-06-14 DIAGNOSIS — Z1231 Encounter for screening mammogram for malignant neoplasm of breast: Secondary | ICD-10-CM | POA: Insufficient documentation

## 2020-09-03 ENCOUNTER — Telehealth: Payer: Self-pay | Admitting: Family Medicine

## 2020-09-03 NOTE — Telephone Encounter (Signed)
Copied from [place] [id number]. Topic: Medicare AWV >> Sep 03, 2020  1:24 PM Cher Nakai R wrote: Reason for CRM:  Left message for patient to call back and schedule Medicare Annual Wellness Visit (AWV) in office.   If not able to come in office, please offer to do virtually.   Last AWV 01/31/2019  Please schedule at anytime with [facility] Health Advisor.  If any questions, please contact me at [phone]

## 2020-09-26 DIAGNOSIS — L570 Actinic keratosis: Secondary | ICD-10-CM | POA: Diagnosis not present

## 2020-09-26 DIAGNOSIS — D485 Neoplasm of uncertain behavior of skin: Secondary | ICD-10-CM | POA: Diagnosis not present

## 2020-09-26 DIAGNOSIS — D225 Melanocytic nevi of trunk: Secondary | ICD-10-CM | POA: Diagnosis not present

## 2020-10-24 DIAGNOSIS — L905 Scar conditions and fibrosis of skin: Secondary | ICD-10-CM | POA: Diagnosis not present

## 2020-10-24 DIAGNOSIS — L578 Other skin changes due to chronic exposure to nonionizing radiation: Secondary | ICD-10-CM | POA: Diagnosis not present

## 2020-10-24 DIAGNOSIS — L821 Other seborrheic keratosis: Secondary | ICD-10-CM | POA: Diagnosis not present

## 2020-10-24 DIAGNOSIS — D225 Melanocytic nevi of trunk: Secondary | ICD-10-CM | POA: Diagnosis not present

## 2020-10-24 NOTE — Progress Notes (Unsigned)
Subjective:   Yvette Rogers is a 75 y.o. female who presents for Medicare Annual (Subsequent) preventive examination.  I connected with Marlin Canary today by telephone and verified that I am speaking with the correct person using two identifiers. Location patient: home Location provider: work Persons participating in the virtual visit: patient, provider.   I discussed the limitations, risks, security and privacy concerns of performing an evaluation and management service by telephone and the availability of in person appointments. I also discussed with the patient that there may be a patient responsible charge related to this service. The patient expressed understanding and verbally consented to this telephonic visit.    Interactive audio and video telecommunications were attempted between this provider and patient, however failed, due to patient having technical difficulties OR patient did not have access to video capability.  We continued and completed visit with audio only.   Review of Systems    N/A  Cardiac Risk Factors include: advanced age (>10men, >40 women)     Objective:    There were no vitals filed for this visit. There is no height or weight on file to calculate BMI.  Advanced Directives 10/25/2020 01/31/2019 10/13/2018 01/14/2018 01/10/2018 09/25/2016 05/18/2016  Does Patient Have a Medical Advance Directive? Yes No No No No Yes No  Type of Paramedic of [place];Living will - - - - - -  Copy of [place] in Chart? Yes - validated most recent copy scanned in chart (See row information) - No - copy requested - - - -  Would patient like information on creating a medical advance directive? - Yes (ED - Information included in AVS) - Yes (MAU/Ambulatory/Procedural Areas - Information given) No - Patient declined - No - patient declined information    Current Medications (verified) Outpatient Encounter Medications as of 10/25/2020   Medication Sig  . clonazePAM (KLONOPIN) 1 MG tablet Take 0.5-1 tablets (0.5-1 mg total) by mouth 2 (two) times daily as needed for anxiety.  . meloxicam (MOBIC) 15 MG tablet TAKE 1 TABLET(15 MG) BY MOUTH DAILY  . azelastine (OPTIVAR) 0.05 % ophthalmic solution Place 1 drop into the left eye daily.  (Patient not taking: Reported on 10/25/2020)  . benzonatate (TESSALON) 200 MG capsule Take 1 capsule (200 mg total) by mouth 2 (two) times daily as needed for cough. (Patient not taking: No sig reported)  . buPROPion (WELLBUTRIN XL) 300 MG 24 hr tablet Take 1 tablet (300 mg total) by mouth daily. (Patient not taking: No sig reported)  . doxycycline (VIBRA-TABS) 100 MG tablet Take 1 tablet (100 mg total) by mouth 2 (two) times daily. (Patient not taking: Reported on 10/25/2020)  . omeprazole (PRILOSEC) 20 MG capsule Take 1 capsule (20 mg total) by mouth daily. (Patient not taking: No sig reported)  . promethazine-dextromethorphan (PROMETHAZINE-DM) 6.25-15 MG/5ML syrup Take 5 mLs by mouth at bedtime as needed. (Patient not taking: No sig reported)  . sertraline (ZOLOFT) 100 MG tablet TAKE 1 TABLET(100 MG) BY MOUTH DAILY (Patient not taking: Reported on 10/25/2020)  . traZODone (DESYREL) 150 MG tablet TAKE 1 TABLET(150 MG) BY MOUTH AT BEDTIME (Patient not taking: Reported on 10/25/2020)   No facility-administered encounter medications on file as of 10/25/2020.    Allergies (verified) Bactrim [sulfamethoxazole-trimethoprim], Penicillin v potassium, and Penicillins   History: Past Medical History:  Diagnosis Date  . Abnormal EKG   . Anxiety   . Arthritis   . Cancer ([place])    skin ca  .  Chest pain   . Depression   . Shingles    Past Surgical History:  Procedure Laterality Date  . BUNIONECTOMY Right   . CHOLECYSTECTOMY    . COLONOSCOPY    . COLONOSCOPY WITH PROPOFOL N/A 10/13/2018   Procedure: COLONOSCOPY WITH PROPOFOL;  Surgeon: Manya Silvas, MD;  Location: [facility] ENDOSCOPY;  Service:  Endoscopy;  Laterality: N/A;  . EYE SURGERY Bilateral    Cataract removed  . FOOT SURGERY    . GALLBLADDER SURGERY    . SKIN CANCER EXCISION     face  . SKIN CANCER EXCISION Left 08/22/2013   left nostril area  . subconjunctival     subconjunctival hemorrhage   Family History  Problem Relation Age of Onset  . Cancer Mother        pancreatic  . Diabetes Mother   . Kidney Stones Mother   . COPD Father   . Diabetes Father   . Macular degeneration Father   . Breast cancer Sister 58  . Cancer Brother        Leukemia  . Breast cancer Cousin        mat cousin   Social History   Socioeconomic History  . Marital status: Widowed    Spouse name: Not on file  . Number of children: 1  . Years of education: Not on file  . Highest education level: 12th grade  Occupational History  . 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 Smoking status: Never Smoker  . Smokeless tobacco: Never Used  Vaping Use  . Vaping Use: Never used  Substance and Sexual Activity  . Alcohol use: Yes    Comment: rarely every 6-12 months  . Drug use: No  . Sexual activity: Not on file  Other Topics Concern  . Not on file  Social History Narrative  . Not on file   Social Determinants of Health   Financial Resource Strain: Low Risk   . Difficulty of Paying Living Expenses: Not hard at all  Food Insecurity: No Food Insecurity  . Worried About Charity fundraiser in the Last Year: Never true  . Ran Out of Food in the Last Year: Never true  Transportation Needs: No Transportation Needs  . Lack of Transportation (Medical): No  . Lack of Transportation (Non-Medical): No  Physical Activity: Inactive  . Days of Exercise per Week: 0 days  . Minutes of Exercise per Session: 0 min  Stress: Stress Concern Present  . Feeling of Stress : To some extent  Social Connections: Moderately Isolated  . Frequency of Communication with Friends and Family: More than three times  a week  . Frequency of Social Gatherings with Friends and Family: More than three times a week  . Attends Religious Services: More than 4 times per year  . Active Member of Clubs or Organizations: No  . Attends Archivist Meetings: Never  . Marital Status: Widowed    Tobacco Counseling Counseling given: Not Answered   Clinical Intake:  Pre-visit preparation completed: Yes  Pain : No/denies pain     Nutritional Risks: None Diabetes: No  How often do you need to have someone help you when you read instructions, pamphlets, or other written materials from your doctor or pharmacy?: 1 - Never  Diabetic? No  Interpreter Needed?: No  Information entered by :: [facility], LPN   Activities of Daily Living In your present state of health, do you  have any difficulty performing the following activities: 10/25/2020  Hearing? N  Vision? N  Difficulty concentrating or making decisions? N  Walking or climbing stairs? N  Dressing or bathing? N  Doing errands, shopping? N  Preparing Food and eating ? N  Using the Toilet? N  In the past six months, have you accidently leaked urine? N  Do you have problems with loss of bowel control? N  Managing your Medications? N  Managing your Finances? N  Housekeeping or managing your Housekeeping? N  Some recent data might be hidden    Patient Care Team: Jerrol Banana., MD as PCP - General (Family Medicine) Anell Barr, OD (Optometry)  Indicate any recent Medical Services you may have received from other than [facility] providers in the past year (date may be approximate).     Assessment:   This is a routine wellness examination for Shelisha.  Hearing/Vision screen No exam data present  Dietary issues and exercise activities discussed: Current Exercise Habits: The patient does not participate in regular exercise at present, Exercise limited by: None identified  Goals    . DIET - INCREASE WATER INTAKE     Recommend to  drink at least 6-8 8oz glasses of water per day.    . Exercise 3x per week (30 min per time)     Recommend to start exercising 3 days a week for at least 30 minutes at a time.      Depression Screen PHQ 2/9 Scores 10/25/2020 01/31/2019 01/14/2018 11/17/2017 03/26/2017 03/26/2017 09/25/2016  PHQ - 2 Score 4 4 4 3 4 4 4   PHQ- 9 Score 15 19 14 11 14 14 18     Fall Risk Fall Risk  10/25/2020 01/31/2019 01/14/2018 11/17/2017 09/25/2016  Falls in the past year? 0 1 No No No  Number falls in past yr: 0 0 - - -  Injury with Fall? 0 0 - - -  Follow up - Falls prevention discussed - - -    FALL RISK PREVENTION PERTAINING TO THE HOME:  Any stairs in or around the home? No  If so, are there any without handrails? No  Home free of loose throw rugs in walkways, pet beds, electrical cords, etc? Yes  Adequate lighting in your home to reduce risk of falls? Yes   ASSISTIVE DEVICES UTILIZED TO PREVENT FALLS:  Life alert? No  Use of a cane, walker or w/c? No  Grab bars in the bathroom? No  Shower chair or bench in shower? No  Elevated toilet seat or a handicapped toilet? No    Cognitive Function: Normal cognitive status assessed by observation by this Nurse Health Advisor. No abnormalities found.       6CIT Screen 09/25/2016  What Year? 0 points  What month? 0 points  What time? 0 points  Count back from 20 0 points  Months in reverse 0 points  Repeat phrase 2 points  Total Score 2    Immunizations Immunization History  Administered Date(s) Administered  . Influenza, High Dose Seasonal PF 06/15/2020  . Influenza, Seasonal, Injecte, Preservative Fre 07/10/2016  . Influenza,inj,Quad PF,6+ Mos 07/03/2017, 06/10/2018  . Moderna Sars-Covid-2 Vaccination 10/19/2019, 11/17/2019  . PPD Test 12/11/2015, 12/25/2015  . Pneumococcal Conjugate-13 09/06/2015  . Pneumococcal Polysaccharide-23 09/25/2016  . Tdap 02/28/2013  . Zoster 02/28/2013    TDAP status: Up to date  Flu Vaccine status: Up to  date  Pneumococcal vaccine status: Up to date  Covid-19 vaccine status: Completed vaccines  Qualifies for Shingles Vaccine? Yes   Zostavax completed Yes   Shingrix Completed?: No.    Education has been provided regarding the importance of this vaccine. Patient has been advised to call insurance company to determine out of pocket expense if they have not yet received this vaccine. Advised may also receive vaccine at local pharmacy or Health Dept. Verbalized acceptance and understanding.  Screening Tests Health Maintenance  Topic Date Due  . COVID-19 Vaccine (3 - Booster for Moderna series) 05/19/2020  . DEXA SCAN  10/25/2021 (Originally 07/29/2013)  . MAMMOGRAM  06/14/2022  . TETANUS/TDAP  03/01/2023  . COLONOSCOPY (Pts 45-55yrs Insurance coverage will need to be confirmed)  10/14/2023  . INFLUENZA VACCINE  Completed  . Hepatitis C Screening  Completed  . PNA vac Low Risk Adult  Completed    Health Maintenance  Health Maintenance Due  Topic Date Due  . COVID-19 Vaccine (3 - Booster for Moderna series) 05/19/2020    Colorectal cancer screening: Type of screening: Colonoscopy. Completed 10/13/18. Repeat every 5 years  Mammogram status: Completed 06/14/20. Repeat every year  Bone Density status: Currently due, declined order at this time.   Lung Cancer Screening: (Low Dose CT Chest recommended if Age 32-80 years, 30 pack-year currently smoking OR have quit w/in 15years.) does not qualify.   Additional Screening:  Hepatitis C Screening: Up to date  Vision Screening: Recommended annual ophthalmology exams for early detection of glaucoma and other disorders of the eye. Is the patient up to date with their annual eye exam?  Yes  Who is the provider or what is the name of the office in which the patient attends annual eye exams? Dr Ellin Mayhew If pt is not established with a provider, would they like to be referred to a provider to establish care? No .   Dental Screening: Recommended  annual dental exams for proper oral hygiene  Community Resource Referral / Chronic Care Management: CRR required this visit?  No   CCM required this visit?  No      Plan:     I have personally reviewed and noted the following in the patient's chart:   . Medical and social history . Use of alcohol, tobacco or illicit drugs  . Current medications and supplements . Functional ability and status . Nutritional status . Physical activity . Advanced directives . List of other physicians . Hospitalizations, surgeries, and ER visits in previous 12 months . Vitals . Screenings to include cognitive, depression, and falls . Referrals and appointments  In addition, I have reviewed and discussed with patient certain preventive protocols, quality metrics, and best practice recommendations. A written personalized care plan for preventive services as well as general preventive health recommendations were provided to patient.     Katniss Weedman [place], [place]   [id number]   Nurse Notes: Declined a DEXA order at this time. Pt has received her Covid booster. Requested she bring vaccine card to next in office apt to up date in chart.

## 2020-10-25 ENCOUNTER — Ambulatory Visit (INDEPENDENT_AMBULATORY_CARE_PROVIDER_SITE_OTHER): Payer: PPO

## 2020-10-25 ENCOUNTER — Other Ambulatory Visit: Payer: Self-pay

## 2020-10-25 DIAGNOSIS — Z0000 Encounter for general adult medical examination without abnormal findings: Secondary | ICD-10-CM

## 2020-10-25 NOTE — Patient Instructions (Signed)
Yvette Rogers , Thank you for taking time to come for your Medicare Wellness Visit. I appreciate your ongoing commitment to your health goals. Please review the following plan we discussed and let me know if I can assist you in the future.   Screening recommendations/referrals: Colonoscopy: Up to date, due 09/2023 Mammogram: Up to date, due 05/2021 Bone Density: Currently due, declined order. Recommended yearly ophthalmology/optometry visit for glaucoma screening and checkup Recommended yearly dental visit for hygiene and checkup  Vaccinations: Influenza vaccine: Done 06/2020 Pneumococcal vaccine: Completed series Tdap vaccine: Up to date, due 02/2023 Shingles vaccine: Shingrix discussed. Please contact your pharmacy for coverage information.     Advanced directives: Currently on file.  Conditions/risks identified: Recommend increasing water intake to 6-8 8 oz glasses a day. Also recommend to start walking 3 days a week for 30 minutes at a time.  Next appointment: 12/13/20 @ 4:00 PM with Dr Rosanna Randy    Preventive Care 43 Years and Older, Female Preventive care refers to lifestyle choices and visits with your health care provider that can promote health and wellness. What does preventive care include?  A yearly physical exam. This is also called an annual well check.  Dental exams once or twice a year.  Routine eye exams. Ask your health care provider how often you should have your eyes checked.  Personal lifestyle choices, including:  Daily care of your teeth and gums.  Regular physical activity.  Eating a healthy diet.  Avoiding tobacco and drug use.  Limiting alcohol use.  Practicing safe sex.  Taking low-dose aspirin every day.  Taking vitamin and mineral supplements as recommended by your health care provider. What happens during an annual well check? The services and screenings done by your health care provider during your annual well check will depend on your age,  overall health, lifestyle risk factors, and family history of disease. Counseling  Your health care provider may ask you questions about your:  Alcohol use.  Tobacco use.  Drug use.  Emotional well-being.  Home and relationship well-being.  Sexual activity.  Eating habits.  History of falls.  Memory and ability to understand (cognition).  Work and work Statistician.  Reproductive health. Screening  You may have the following tests or measurements:  Height, weight, and BMI.  Blood pressure.  Lipid and cholesterol levels. These may be checked every 5 years, or more frequently if you are over 79 years old.  Skin check.  Lung cancer screening. You may have this screening every year starting at age 73 if you have a 30-pack-year history of smoking and currently smoke or have quit within the past 15 years.  Fecal occult blood test (FOBT) of the stool. You may have this test every year starting at age 73.  Flexible sigmoidoscopy or colonoscopy. You may have a sigmoidoscopy every 5 years or a colonoscopy every 10 years starting at age 73.  Hepatitis C blood test.  Hepatitis B blood test.  Sexually transmitted disease (STD) testing.  Diabetes screening. This is done by checking your blood sugar (glucose) after you have not eaten for a while (fasting). You may have this done every 1-3 years.  Bone density scan. This is done to screen for osteoporosis. You may have this done starting at age 73.  Mammogram. This may be done every 1-2 years. Talk to your health care provider about how often you should have regular mammograms. Talk with your health care provider about your test results, treatment options, and if necessary,  the need for more tests. Vaccines  Your health care provider may recommend certain vaccines, such as:  Influenza vaccine. This is recommended every year.  Tetanus, diphtheria, and acellular pertussis (Tdap, Td) vaccine. You may need a Td booster every 10  years.  Zoster vaccine. You may need this after age 73.  Pneumococcal 13-valent conjugate (PCV13) vaccine. One dose is recommended after age 8.  Pneumococcal polysaccharide (PPSV23) vaccine. One dose is recommended after age 73. Talk to your health care provider about which screenings and vaccines you need and how often you need them. This information is not intended to replace advice given to you by your health care provider. Make sure you discuss any questions you have with your health care provider. Document Released: 09/28/2015 Document Revised: 05/21/2016 Document Reviewed: 07/03/2015 Elsevier Interactive Patient Education  2017 [place] Prevention in the Home Falls can cause injuries. They can happen to people of all ages. There are many things you can do to make your home safe and to help prevent falls. What can I do on the outside of my home?  Regularly fix the edges of walkways and driveways and fix any cracks.  Remove anything that might make you trip as you walk through a door, such as a raised step or threshold.  Trim any bushes or trees on the path to your home.  Use bright outdoor lighting.  Clear any walking paths of anything that might make someone trip, such as rocks or tools.  Regularly check to see if handrails are loose or broken. Make sure that both sides of any steps have handrails.  Any raised decks and porches should have guardrails on the edges.  Have any leaves, snow, or ice cleared regularly.  Use sand or salt on walking paths during winter.  Clean up any spills in your garage right away. This includes oil or grease spills. What can I do in the bathroom?  Use night lights.  Install grab bars by the toilet and in the tub and shower. Do not use towel bars as grab bars.  Use non-skid mats or decals in the tub or shower.  If you need to sit down in the shower, use a plastic, non-slip stool.  Keep the floor dry. Clean up any water that  spills on the floor as soon as it happens.  Remove soap buildup in the tub or shower regularly.  Attach bath mats securely with double-sided non-slip rug tape.  Do not have throw rugs and other things on the floor that can make you trip. What can I do in the bedroom?  Use night lights.  Make sure that you have a light by your bed that is easy to reach.  Do not use any sheets or blankets that are too big for your bed. They should not hang down onto the floor.  Have a firm chair that has side arms. You can use this for support while you get dressed.  Do not have throw rugs and other things on the floor that can make you trip. What can I do in the kitchen?  Clean up any spills right away.  Avoid walking on wet floors.  Keep items that you use a lot in easy-to-reach places.  If you need to reach something above you, use a strong step stool that has a grab bar.  Keep electrical cords out of the way.  Do not use floor polish or wax that makes floors slippery. If you must use  wax, use non-skid floor wax.  Do not have throw rugs and other things on the floor that can make you trip. What can I do with my stairs?  Do not leave any items on the stairs.  Make sure that there are handrails on both sides of the stairs and use them. Fix handrails that are broken or loose. Make sure that handrails are as long as the stairways.  Check any carpeting to make sure that it is firmly attached to the stairs. Fix any carpet that is loose or worn.  Avoid having throw rugs at the top or bottom of the stairs. If you do have throw rugs, attach them to the floor with carpet tape.  Make sure that you have a light switch at the top of the stairs and the bottom of the stairs. If you do not have them, ask someone to add them for you. What else can I do to help prevent falls?  Wear shoes that:  Do not have high heels.  Have rubber bottoms.  Are comfortable and fit you well.  Are closed at the  toe. Do not wear sandals.  If you use a stepladder:  Make sure that it is fully opened. Do not climb a closed stepladder.  Make sure that both sides of the stepladder are locked into place.  Ask someone to hold it for you, if possible.  Clearly mark and make sure that you can see:  Any grab bars or handrails.  First and last steps.  Where the edge of each step is.  Use tools that help you move around (mobility aids) if they are needed. These include:  Canes.  Walkers.  Scooters.  Crutches.  Turn on the lights when you go into a dark area. Replace any light bulbs as soon as they burn out.  Set up your furniture so you have a clear path. Avoid moving your furniture around.  If any of your floors are uneven, fix them.  If there are any pets around you, be aware of where they are.  Review your medicines with your doctor. Some medicines can make you feel dizzy. This can increase your chance of falling. Ask your doctor what other things that you can do to help prevent falls. This information is not intended to replace advice given to you by your health care provider. Make sure you discuss any questions you have with your health care provider. Document Released: 06/28/2009 Document Revised: 02/07/2016 Document Reviewed: 10/06/2014 Elsevier Interactive Patient Education  2017 [employer].

## 2020-12-07 NOTE — Progress Notes (Signed)
I,[name],acting as a scribe for [name], MD.,have documented all relevant documentation on the behalf of [name], MD,as directed by  [name], MD while in the presence of [name], MD.   Established patient visit   Patient: Yvette Rogers   DOB: 1946/03/21   75 y.o. Female  MRN: [id number] Visit Date: 12/10/2020  Today's healthcare provider: [name], MD   Chief Complaint  Patient presents with  . Follow-up  . Depression   Subjective    HPI  Patient is clinically stable.  Mostly doing fairly well. He has chronic tingling in her legs.  Chronic anxiety and depression both stable.  Chronic right leg swelling.  This is not acute and she has no trauma and no pain. Depression, Follow-up  She  was last seen for this 8 months ago. Changes made at last visit include increasing Sertraline to 100mg  daily.   She reports good compliance with treatment. She is not having side effects. none  She reports good tolerance of treatment. Current symptoms include: depressed mood, fatigue and insomnia She feels she is Improved since last visit.  Depression screen [facility] 2/9 12/10/2020 10/25/2020 01/31/2019  Decreased Interest 2 2 2   Down, Depressed, Hopeless 2 2 2   PHQ - 2 Score 4 4 4   Altered sleeping 2 1 3   Tired, decreased energy 2 2 3   Change in appetite 1 2 2   Feeling bad or failure about yourself  2 2 2   Trouble concentrating 1 2 2   Moving slowly or fidgety/restless 0 2 3  Suicidal thoughts 0 0 0  PHQ-9 Score 12 15 19   Difficult doing work/chores Not difficult at all Somewhat difficult Somewhat difficult  Some recent data might be hidden    Acute gastritis without hemorrhage, unspecified gastritis type From 03/22/2019- Omeprazole daily--rtc 1 month  Patient also wants to discuss right leg swelling and pain. Swelling has been occurring in right leg for close to 2 years. Patient had a [place] at [facility] one year ago. Patient states she  has been treating symptoms with ice and elevation.    Patient has also been having increased migraines.       Medications: Outpatient Medications Prior to Visit  Medication Sig  . clonazePAM (KLONOPIN) 1 MG tablet Take 0.5-1 tablets (0.5-1 mg total) by mouth 2 (two) times daily as needed for anxiety.  . meloxicam (MOBIC) 15 MG tablet TAKE 1 TABLET(15 MG) BY MOUTH DAILY  . sertraline (ZOLOFT) 100 MG tablet TAKE 1 TABLET(100 MG) BY MOUTH DAILY  . traZODone (DESYREL) 150 MG tablet TAKE 1 TABLET(150 MG) BY MOUTH AT BEDTIME  . azelastine (OPTIVAR) 0.05 % ophthalmic solution Place 1 drop into the left eye daily.  (Patient not taking: No sig reported)  . benzonatate (TESSALON) 200 MG capsule Take 1 capsule (200 mg total) by mouth 2 (two) times daily as needed for cough. (Patient not taking: No sig reported)  . buPROPion (WELLBUTRIN XL) 300 MG 24 hr tablet Take 1 tablet (300 mg total) by mouth daily. (Patient not taking: No sig reported)  . doxycycline (VIBRA-TABS) 100 MG tablet Take 1 tablet (100 mg total) by mouth 2 (two) times daily. (Patient not taking: No sig reported)  . omeprazole (PRILOSEC) 20 MG capsule Take 1 capsule (20 mg total) by mouth daily. (Patient not taking: No sig reported)  . promethazine-dextromethorphan (PROMETHAZINE-DM) 6.25-15 MG/5ML syrup Take 5 mLs by mouth at bedtime as needed. (Patient not taking: No  sig reported)   No facility-administered medications prior to visit.    Review of Systems  Constitutional: Negative for activity change and fatigue.  Respiratory: Negative for cough and shortness of breath.   Cardiovascular: Negative for chest pain, palpitations and leg swelling.  Gastrointestinal: Negative for abdominal distention, blood in stool, constipation, diarrhea, nausea, rectal pain and vomiting.  Neurological: Negative for dizziness and headaches.  Psychiatric/Behavioral: Negative for agitation, self-injury, sleep disturbance and suicidal ideas. The patient is  not nervous/anxious and is not hyperactive.         Objective    BP 131/84 (BP Location: Right Arm, Patient Position: Sitting, Cuff Size: Large)   Pulse 85   Temp 98 F (36.7 C) (Oral)   Resp 16   Ht 5\' 6"  (1.676 m)   LMP  (LMP Unknown)   SpO2 97%   BMI 29.83 kg/m  BP Readings from Last 3 Encounters:  12/13/20 (!) 168/93  12/10/20 131/84  02/01/20 124/82   Wt Readings from Last 3 Encounters:  12/13/20 201 lb 9.6 oz (91.4 kg)  02/01/20 184 lb 12.8 oz (83.8 kg)  10/13/18 175 lb (79.4 kg)       Physical Exam Vitals reviewed.  Constitutional:      Appearance: She is well-developed.  HENT:     Head: Normocephalic and atraumatic.     Comments: Frontal sinuses tender.    Right Ear: External ear normal.     Left Ear: External ear normal.     Mouth/Throat:     Pharynx: Oropharynx is clear.  Eyes:     General: No scleral icterus.    Conjunctiva/sclera: Conjunctivae normal.  Neck:     Thyroid: No thyromegaly.  Cardiovascular:     Rate and Rhythm: Normal rate and regular rhythm.     Heart sounds: Normal heart sounds.  Pulmonary:     Effort: Pulmonary effort is normal.     Breath sounds: Normal breath sounds.  Abdominal:     Palpations: Abdomen is soft.  Musculoskeletal:     Comments: Pulses are normal but the left calf measures 15 inches the right calf measures 16 inches.  Negative Homans and no cords noted.  Skin:    General: Skin is warm and dry.  Neurological:     General: No focal deficit present.     Mental Status: She is alert and oriented to person, place, and time.  Psychiatric:        Mood and Affect: Mood normal.        Behavior: Behavior normal.        Thought Content: Thought content normal.        Judgment: Judgment normal.       No results found for any visits on 12/10/20.  Assessment & Plan     1. Moderate episode of recurrent major depressive disorder (HCC) Continue present meds.  Recommend avoid assistance program for counseling. - Lipid  panel - TSH - CBC w/Diff/Platelet - Comprehensive Metabolic Panel (CMET) - Vitamin B12 - Folate  2. Anxiety EAP rather than more benzodiazepines. - Lipid panel - TSH - CBC w/Diff/Platelet - Comprehensive Metabolic Panel (CMET) - Vitamin B12 - Folate  3. Hyperlipidemia, unspecified hyperlipidemia type  - Lipid panel - TSH - CBC w/Diff/Platelet - Comprehensive Metabolic Panel (CMET) - Vitamin B12 - Folate  4. Leg swelling Refer to vascular but certainly no signs of acute DVT.  No signs of orthopedic injury. - Lipid panel - TSH - CBC w/Diff/Platelet - Comprehensive Metabolic  Panel (CMET) - Vitamin B12 - Folate - Ambulatory referral to Vascular Surgery  5. Other insomnia  - Lipid panel - TSH - CBC w/Diff/Platelet - Comprehensive Metabolic Panel (CMET) - Vitamin B12 - Folate  6. Neuropathy Labs. - Lipid panel - TSH - CBC w/Diff/Platelet - Comprehensive Metabolic Panel (CMET) - Vitamin B12 - Folate  7. Tingling in extremities  - Lipid panel - TSH - CBC w/Diff/Platelet - Comprehensive Metabolic Panel (CMET) - Vitamin B12 - Folate   No follow-ups on file.      I, [name], MD, have reviewed all documentation for this visit. The documentation on 12/23/20 for the exam, diagnosis, procedures, and orders are all accurate and complete.    Arriel Victor Cranford Mon, MD  [facility] [phone] (phone) [phone] (fax)  [place]

## 2020-12-10 ENCOUNTER — Ambulatory Visit (INDEPENDENT_AMBULATORY_CARE_PROVIDER_SITE_OTHER): Payer: PPO | Admitting: Family Medicine

## 2020-12-10 ENCOUNTER — Encounter: Payer: Self-pay | Admitting: Family Medicine

## 2020-12-10 ENCOUNTER — Other Ambulatory Visit: Payer: Self-pay

## 2020-12-10 VITALS — BP 131/84 | HR 85 | Temp 98.0°F | Resp 16 | Ht 66.0 in

## 2020-12-10 DIAGNOSIS — E785 Hyperlipidemia, unspecified: Secondary | ICD-10-CM | POA: Diagnosis not present

## 2020-12-10 DIAGNOSIS — M7989 Other specified soft tissue disorders: Secondary | ICD-10-CM | POA: Diagnosis not present

## 2020-12-10 DIAGNOSIS — F419 Anxiety disorder, unspecified: Secondary | ICD-10-CM | POA: Diagnosis not present

## 2020-12-10 DIAGNOSIS — R202 Paresthesia of skin: Secondary | ICD-10-CM | POA: Diagnosis not present

## 2020-12-10 DIAGNOSIS — G629 Polyneuropathy, unspecified: Secondary | ICD-10-CM | POA: Diagnosis not present

## 2020-12-10 DIAGNOSIS — G4709 Other insomnia: Secondary | ICD-10-CM

## 2020-12-10 DIAGNOSIS — F331 Major depressive disorder, recurrent, moderate: Secondary | ICD-10-CM | POA: Diagnosis not present

## 2020-12-13 ENCOUNTER — Ambulatory Visit: Payer: PPO | Admitting: Family Medicine

## 2020-12-13 ENCOUNTER — Other Ambulatory Visit: Payer: Self-pay

## 2020-12-13 ENCOUNTER — Encounter (INDEPENDENT_AMBULATORY_CARE_PROVIDER_SITE_OTHER): Payer: Self-pay | Admitting: Vascular Surgery

## 2020-12-13 ENCOUNTER — Ambulatory Visit (INDEPENDENT_AMBULATORY_CARE_PROVIDER_SITE_OTHER): Payer: PPO | Admitting: Vascular Surgery

## 2020-12-13 DIAGNOSIS — I890 Lymphedema, not elsewhere classified: Secondary | ICD-10-CM | POA: Diagnosis not present

## 2020-12-13 NOTE — Progress Notes (Signed)
MRN : [id number]  Yvette Rogers is a 75 y.o. ([birth year]) female who presents with chief complaint of  Chief Complaint  Patient presents with  . New Patient (Initial Visit)    Ref Rosanna Randy leg swelling  .  History of Present Illness:   Patient is seen for evaluation of leg swelling. The patient first noticed the swelling remotely but is now concerned because of a significant increase in the overall edema. The swelling is associated with pain and discoloration. The patient notes that in the morning the legs are significantly improved but they steadily worsened throughout the course of the day. Elevation makes the legs better, dependency makes them much worse.   There is no history of ulcerations associated with the swelling.   The patient denies any recent changes in their medications.  The patient has not been wearing graduated compression.  The patient has no had any past angiography, interventions or vascular surgery.  The patient denies a history of DVT or PE. There is no prior history of phlebitis. There is no history of primary lymphedema.  There is no history of radiation treatment to the groin or pelvis No history of malignancies. No history of trauma or groin or pelvic surgery. No history of foreign travel or parasitic infections area    Current Meds  Medication Sig  . clonazePAM (KLONOPIN) 1 MG tablet Take 0.5-1 tablets (0.5-1 mg total) by mouth 2 (two) times daily as needed for anxiety.  . meloxicam (MOBIC) 15 MG tablet TAKE 1 TABLET(15 MG) BY MOUTH DAILY  . sertraline (ZOLOFT) 100 MG tablet TAKE 1 TABLET(100 MG) BY MOUTH DAILY  . traZODone (DESYREL) 150 MG tablet TAKE 1 TABLET(150 MG) BY MOUTH AT BEDTIME    Past Medical History:  Diagnosis Date  . Abnormal EKG   . Anxiety   . Arthritis   . Cancer ([place])    skin ca  . Chest pain   . Depression   . Shingles     Past Surgical History:  Procedure Laterality Date  . BUNIONECTOMY Right   . CHOLECYSTECTOMY     . COLONOSCOPY    . COLONOSCOPY WITH PROPOFOL N/A 10/13/2018   Procedure: COLONOSCOPY WITH PROPOFOL;  Surgeon: Manya Silvas, MD;  Location: [facility] ENDOSCOPY;  Service: Endoscopy;  Laterality: N/A;  . EYE SURGERY Bilateral    Cataract removed  . FOOT SURGERY    . GALLBLADDER SURGERY    . SKIN CANCER EXCISION     face  . SKIN CANCER EXCISION Left 08/22/2013   left nostril area  . subconjunctival     subconjunctival hemorrhage    Social History Social History   Tobacco Use  . Smoking status: Never Smoker  . Smokeless tobacco: Never Used  Vaping Use  . Vaping Use: Never used  Substance Use Topics  . Alcohol use: Yes    Comment: rarely every 6-12 months  . Drug use: No    Family History Family History  Problem Relation Age of Onset  . Cancer Mother        pancreatic  . Diabetes Mother   . Kidney Stones Mother   . COPD Father   . Diabetes Father   . Macular degeneration Father   . Breast cancer Sister 2  . Cancer Brother        Leukemia  . Breast cancer Cousin        mat cousin  No family history of bleeding/clotting disorders, porphyria or autoimmune disease   Allergies  Allergen Reactions  . Bactrim [Sulfamethoxazole-Trimethoprim] Swelling  . Penicillin V Potassium Hives  . Penicillins Hives     REVIEW OF SYSTEMS (Negative unless checked)  Constitutional: [] Weight loss  [] Fever  [] Chills Cardiac: [] Chest pain   [] Chest pressure   [] Palpitations   [] Shortness of breath when laying flat   [] Shortness of breath with exertion. Vascular:  [] Pain in legs with walking   [x] Pain in legs at rest  [] History of DVT   [] Phlebitis   [x] Swelling in legs   [] Varicose veins   [] Non-healing ulcers Pulmonary:   [] Uses home oxygen   [] Productive cough   [] Hemoptysis   [] Wheeze  [] COPD   [] Asthma Neurologic:  [] Dizziness   [] Seizures   [] History of stroke   [] History of TIA  [] Aphasia   [] Vissual changes   [] Weakness or numbness in arm   [] Weakness or numbness in  leg Musculoskeletal:   [] Joint swelling   [] Joint pain   [] Low back pain Hematologic:  [] Easy bruising  [] Easy bleeding   [] Hypercoagulable state   [] Anemic Gastrointestinal:  [] Diarrhea   [] Vomiting  [] Gastroesophageal reflux/heartburn   [] Difficulty swallowing. Genitourinary:  [] Chronic kidney disease   [] Difficult urination  [] Frequent urination   [] Blood in urine Skin:  [] Rashes   [] Ulcers  Psychological:  [] History of anxiety   []  History of major depression.  Physical Examination  Vitals:   12/13/20 1542  BP: (!) 168/93  Pulse: 78  Resp: 16  Weight: 201 lb 9.6 oz (91.4 kg)  Height: 5' 5.5" (1.664 m)   Body mass index is 33.04 kg/m. Gen: WD/WN, NAD Head: [place]/AT, No temporalis wasting.  Ear/Nose/Throat: Hearing grossly intact, nares w/o erythema or drainage, poor dentition Eyes: PER, EOMI, sclera nonicteric.  Neck: Supple, no masses.  No bruit or JVD.  Pulmonary:  Good air movement, clear to auscultation bilaterally, no use of accessory muscles.  Cardiac: RRR, normal S1, S2, no Murmurs. Vascular: scattered small varicosities present bilaterally.  No venous stasis changes to the legs bilaterally.  3+ soft pitting edema right > left. Vessel Right Left  Radial Palpable Palpable  PT Palpable Palpable  DP Palpable Palpable  Gastrointestinal: soft, non-distended. No guarding/no peritoneal signs.  Musculoskeletal: M/S 5/5 throughout.  No deformity or atrophy.  Neurologic: CN 2-12 intact. Pain and light touch intact in extremities.  Symmetrical.  Speech is fluent. Motor exam as listed above. Psychiatric: Judgment intact, Mood & affect appropriate for pt's clinical situation. Dermatologic: No rashes or ulcers noted.  No changes consistent with cellulitis. Lymph : No lichenification or skin changes of chronic lymphedema.  CBC Lab Results  Component Value Date   WBC 4.3 03/26/2017   HGB 12.9 03/26/2017   HCT 37.5 03/26/2017   MCV 93 03/26/2017   PLT 252 03/26/2017    BMET     Component Value Date/Time   NA 142 03/26/2017 0918   K 4.6 03/26/2017 0918   CL 104 03/26/2017 0918   CO2 25 03/26/2017 0918   GLUCOSE 92 03/26/2017 0918   BUN 13 03/26/2017 0918   CREATININE 0.78 03/26/2017 0918   CALCIUM 9.3 03/26/2017 0918   GFRNONAA 78 03/26/2017 0918   GFRAA 90 03/26/2017 0918   CrCl cannot be calculated (Patient's most recent lab result is older than the maximum 21 days allowed.).  COAG No results found for: INR, PROTIME  Radiology No results found.   Assessment/Plan 1. Lymphedema I have had a long discussion with the patient regarding swelling and why it  causes symptoms.  Patient  will begin wearing graduated compression stockings class 1 (20-30 mmHg) on a daily basis a prescription was given. The patient will  beginning wearing the stockings first thing in the morning and removing them in the evening. The patient is instructed specifically not to sleep in the stockings.   In addition, behavioral modification will be initiated.  This will include frequent elevation, use of over the counter pain medications and exercise such as walking.  I have reviewed systemic causes for chronic edema such as liver, kidney and cardiac etiologies.  The patient denies problems with these organ systems.    Consideration for a lymph pump will also be made based upon the effectiveness of conservative therapy.  This would help to improve the edema control and prevent sequela such as ulcers and infections   Patient should undergo duplex ultrasound of the venous system to ensure that DVT or reflux is not present.  The patient will follow-up with me after the ultrasound.   - VAS [place] LOWER EXTREMITY VENOUS REFLUX; Future    Hortencia Pilar, MD  12/13/2020 8:13 PM

## 2020-12-23 MED ORDER — SERTRALINE HCL 100 MG PO TABS: ORAL_TABLET | ORAL | 3 refills | Status: DC

## 2020-12-23 MED ORDER — TRAZODONE HCL 150 MG PO TABS: ORAL_TABLET | ORAL | 3 refills | Status: DC

## 2020-12-23 MED ORDER — CLONAZEPAM 1 MG PO TABS: 0.5000 mg | ORAL_TABLET | Freq: Two times a day (BID) | ORAL | 0 refills | Status: DC | PRN

## 2020-12-27 ENCOUNTER — Ambulatory Visit: Payer: Self-pay | Admitting: *Deleted

## 2020-12-27 DIAGNOSIS — M25571 Pain in right ankle and joints of right foot: Secondary | ICD-10-CM

## 2020-12-27 MED ORDER — MELOXICAM 15 MG PO TABS: 15.0000 mg | ORAL_TABLET | Freq: Every day | ORAL | 0 refills | Status: DC

## 2020-12-27 NOTE — Telephone Encounter (Signed)
Pt called stating she is positive for COVID on  12/27/20 at work; she is hoarse and has no energy; she also has headache,cough, and sore throat; she would like to know what to do; tier of symptoms reviewed; she describes her symptoms as mild; recommendations given to pt:  Self-Isolation, Ending Isolation, ED/UC . Instruct the patient to remain in self-quarantine until they meet the "Non-Test Criteria for Ending Self-Isolation". Non-Test Criteria for Ending Self-Isolation All persons with fever and respiratory symptoms should isolate themselves until ALL conditions listed below are met: - at least 10 days since symptoms onset - AND 3 consecutive days fever free without antipyretics (acetaminophen [Tylenol] or ibuprofen [Advil]) - AND improvement in respiratory symptoms . If the patient develops respiratory issues/distress, seek medical care in the Emergency Department, call 911, reports symptoms and report COVID-19 positive test. Patient Instructions . continue to utilize over-the-counter medications for fever (ibuprofen and/or Tylenol) and cough (cough medicine and/or sore throat lozenges). .  wear a mask around people and follow good infection prevention techniques. . should only leave home to seek medical care. . send family for food, prescriptions or medicines; or use delivery service.  . If the patient must leave the home, they must wear a mask in public. Marland Kitchen limit contact with immediate family members or caregivers in the home, and use mask, social distancing, and handwashing to decrease risk to patients. o Please continue good preventive care measures, including frequent hand washing, avoid touching your face, cover coughs/sneezes with tissue or into elbow, stay out of crowds and keep a 6-foot distance from others.   .  patient and family to clean hard surfaces touched by patient frequently with household cleaning products. she verbalized understanding; the pt sees DR Miguel Aschoff and  [facility]; will routre to office for notification. .  Reason for Disposition . General information question, no triage required and triager able to answer question  Answer Assessment - Initial Assessment Questions 1. REASON FOR CALL or QUESTION: "What is your reason for calling today?" or "How can I best help you?" or "What question do you have that I can help answer?"     Do I need prescription medication if I am positive for COVID  Protocols used: [place]

## 2020-12-27 NOTE — Addendum Note (Signed)
Addended by: Wilburt Finlay on: 12/27/2020 04:02 PM   Modules accepted: Orders

## 2020-12-27 NOTE — Telephone Encounter (Signed)
ok 

## 2020-12-27 NOTE — Telephone Encounter (Signed)
Advised patient as below. Patient is also requesting refills on Meloxicam. Ok to send in? Please advise. Thanks!

## 2020-12-27 NOTE — Telephone Encounter (Signed)
If patient is fully vaccinated recommend vitamin C vitamin C D and zinc daily along with Robitussin and push fluids.  Tylenol for aches and pains. If unvaccinated would refer for COVID outpatient management.  I feel certain she is vaccinated.  She develops and other symptoms we will need to evaluate

## 2020-12-27 NOTE — Telephone Encounter (Signed)
Medication sent into the pharmacy. 

## 2021-03-14 ENCOUNTER — Ambulatory Visit (INDEPENDENT_AMBULATORY_CARE_PROVIDER_SITE_OTHER): Payer: PPO | Admitting: Nurse Practitioner

## 2021-03-14 ENCOUNTER — Encounter (INDEPENDENT_AMBULATORY_CARE_PROVIDER_SITE_OTHER): Payer: PPO

## 2021-03-26 ENCOUNTER — Other Ambulatory Visit: Payer: Self-pay | Admitting: Family Medicine

## 2021-03-26 DIAGNOSIS — M25571 Pain in right ankle and joints of right foot: Secondary | ICD-10-CM

## 2021-04-03 ENCOUNTER — Ambulatory Visit
Admission: EM | Admit: 2021-04-03 | Discharge: 2021-04-03 | Disposition: A | Discharge status: Home / Self Care | Payer: PPO | Attending: Sports Medicine | Admitting: Sports Medicine

## 2021-04-03 ENCOUNTER — Other Ambulatory Visit: Payer: Self-pay

## 2021-04-03 ENCOUNTER — Encounter: Payer: Self-pay | Admitting: Emergency Medicine

## 2021-04-03 ENCOUNTER — Encounter (INDEPENDENT_AMBULATORY_CARE_PROVIDER_SITE_OTHER): Payer: PPO

## 2021-04-03 ENCOUNTER — Ambulatory Visit (INDEPENDENT_AMBULATORY_CARE_PROVIDER_SITE_OTHER): Payer: PPO | Admitting: Nurse Practitioner

## 2021-04-03 DIAGNOSIS — K2100 Gastro-esophageal reflux disease with esophagitis, without bleeding: Secondary | ICD-10-CM | POA: Diagnosis not present

## 2021-04-03 DIAGNOSIS — R609 Edema, unspecified: Secondary | ICD-10-CM | POA: Insufficient documentation

## 2021-04-03 DIAGNOSIS — Z20822 Contact with and (suspected) exposure to covid-19: Secondary | ICD-10-CM | POA: Insufficient documentation

## 2021-04-03 DIAGNOSIS — Z79899 Other long term (current) drug therapy: Secondary | ICD-10-CM | POA: Diagnosis not present

## 2021-04-03 DIAGNOSIS — J069 Acute upper respiratory infection, unspecified: Secondary | ICD-10-CM | POA: Diagnosis not present

## 2021-04-03 DIAGNOSIS — Z881 Allergy status to other antibiotic agents status: Secondary | ICD-10-CM | POA: Insufficient documentation

## 2021-04-03 DIAGNOSIS — Z880 Allergy status to penicillin: Secondary | ICD-10-CM | POA: Insufficient documentation

## 2021-04-03 DIAGNOSIS — R079 Chest pain, unspecified: Secondary | ICD-10-CM | POA: Diagnosis not present

## 2021-04-03 LAB — SARS CORONAVIRUS 2 (TAT 6-24 HRS): SARS Coronavirus 2: NEGATIVE

## 2021-04-03 MED ORDER — BENZONATATE 200 MG PO CAPS: 200.0000 mg | ORAL_CAPSULE | Freq: Two times a day (BID) | ORAL | 0 refills | Status: DC | PRN

## 2021-04-03 MED ORDER — OMEPRAZOLE 20 MG PO CPDR: 20.0000 mg | DELAYED_RELEASE_CAPSULE | Freq: Every day | ORAL | 3 refills | Status: DC

## 2021-04-03 NOTE — ED Triage Notes (Signed)
Pt presents today with c/o  nasal congestion, scratchy throat x 3 days. She is also concerned about bilateral feet swelling x couple months.

## 2021-04-03 NOTE — Discharge Instructions (Addendum)
Isolate at home pending the results of your COVID test.  If you test positive then you will have to quarantine for 5 days from the start of your symptoms.  After 5 days you can break quarantine if your symptoms have improved and you have not had a fever for 24 hours without taking Tylenol or ibuprofen.  Use over-the-counter Tylenol and ibuprofen as needed for body aches and fever.  Use the Tessalon Perles every 8 hours as needed for cough.  Use the Atrovent nasal spray, 2 squirts in each nostril every 6 hours, as needed for nasal congestion and postnasal drip.  For your chest pain: Resume taking your Prilosec 20 mg daily as this will cut down on acid production and hopefully help you with that chest pain.  There is no evidence of cardiac issues on your EKG today.  For your feet swelling: As we discussed, purchase and wear compression hosiery to help improve venous return of fluid from your legs.  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  The compression hose will help push the fluid back anterior central circulation so that it can be pumped out of your legs.  Also, get up periodically throughout the day and walk around.  This, in conjunction with the compression hose, will help provide a pumping action and clear that fluid from your lower extremities.  If you develop any increased shortness of breath-especially at rest, you are unable to speak in full sentences, or is a late sign your lips are turning blue you need to go the ER for evaluation.

## 2021-04-03 NOTE — ED Provider Notes (Signed)
[facility]    CSN: [id number] Arrival date & time: 04/03/21  9449      History   Chief Complaint Chief Complaint  Patient presents with   Sore Throat   Nasal Congestion   Leg Swelling    HPI Yvette Rogers is a 75 y.o. female.   HPI  74 old female here for evaluation of nasal congestion and scratchy throat.  Patient reports that she has been experiencing nasal congestion with postnasal drip and a scratchy throat for the last 3 days.  She states she does have an intermittent productive cough.  She did have diarrhea 2 days ago but that is resolved.  She denies fever, runny nose or nasal discharge, shortness breath or wheezing, or GI complaints.  Secondarily, she is complaining of swelling in both of her feet that is been going on for several months.  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  She has been taking Wellbutrin but she states that she has been off that for 6 to 12 months or longer.  She denies any dizziness, palpitations, or syncope.  She states she has had some lower central, nonradiating chest pressure for the past 2 months as well.  She has had this before and mentioned to her primary care doctor who told her it was an ulcer.  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   [place] (subconjunctival hemorrhage) 03/23/2015   Tenosynovitis 03/23/2015   Variants of migraine 09/13/2009   Adjustment disorder with mixed anxiety and depressed mood 06/11/2009    Past Surgical History:  Procedure Laterality Date   BUNIONECTOMY Right     CHOLECYSTECTOMY     COLONOSCOPY     COLONOSCOPY WITH PROPOFOL N/A 10/13/2018   Procedure: COLONOSCOPY WITH PROPOFOL;  Surgeon: Manya Silvas, MD;  Location: [facility] ENDOSCOPY;  Service: Endoscopy;  Laterality: N/A;   EYE SURGERY Bilateral    Cataract removed   FOOT SURGERY     GALLBLADDER SURGERY     SKIN CANCER EXCISION     face   SKIN CANCER EXCISION Left 08/22/2013   left nostril area   subconjunctival     subconjunctival hemorrhage    OB History   No obstetric history on file.      Home Medications    Prior to Admission medications   Medication Sig Start Date End Date Taking? Authorizing Provider  benzonatate (TESSALON) 200 MG capsule Take 1 capsule (200 mg total) by mouth 2 (two) times daily as needed for cough. 04/03/21   Margarette Canada, NP  clonazePAM (KLONOPIN) 1 MG tablet Take 0.5-1 tablets (0.5-1 mg total) by mouth 2 (two) times daily as needed for anxiety. 12/23/20   Jerrol Banana., MD  meloxicam (MOBIC) 15 MG tablet TAKE 1 TABLET(15 MG) BY MOUTH DAILY 03/26/21   Jerrol Banana., MD  omeprazole (PRILOSEC) 20 MG capsule Take 1 capsule (20 mg total) by mouth daily. 04/03/21   Thurmond Butts,  Ysidro Evert, NP  traZODone (DESYREL) 150 MG tablet TAKE 1 TABLET(150 MG) BY MOUTH AT BEDTIME 12/23/20   Jerrol Banana., MD    Family History Family History  Problem Relation Age of Onset   Cancer Mother        pancreatic   Diabetes Mother    Kidney Stones Mother    COPD Father    Diabetes Father    Macular degeneration Father    Breast cancer Sister 69   Cancer Brother        Leukemia   Breast cancer Cousin        mat cousin    Social History Social History   Tobacco Use   Smoking status: Never   Smokeless tobacco: Never  Vaping Use   Vaping Use: Never used  Substance Use Topics   Alcohol use: Yes    Comment: rarely every 6-12 months   Drug use: No     Allergies   Bactrim [sulfamethoxazole-trimethoprim], Penicillin v potassium, and  Penicillins   Review of Systems Review of Systems  Constitutional:  Negative for activity change, appetite change and fever.  HENT:  Positive for congestion, postnasal drip and sore throat. Negative for ear pain and rhinorrhea.   Respiratory:  Positive for cough. Negative for shortness of breath and wheezing.   Cardiovascular:  Positive for chest pain and leg swelling. Negative for palpitations.  Gastrointestinal:  Positive for diarrhea. Negative for nausea and vomiting.  Skin:  Negative for rash.  Neurological:  Negative for dizziness and syncope.  Hematological: Negative.   Psychiatric/Behavioral: Negative.      Physical Exam Triage Vital Signs ED Triage Vitals  Enc Vitals Group     BP 04/03/21 0842 131/76     Pulse Rate 04/03/21 0842 72     Resp 04/03/21 0842 18     Temp 04/03/21 0842 97.8 F (36.6 C)     Temp Source 04/03/21 0842 Oral     SpO2 04/03/21 0842 96 %     Weight --      Height --      Head Circumference --      Peak Flow --      Pain Score 04/03/21 0840 0     Pain Loc --      Pain Edu? --      Excl. in [place]? --    No data found.  Updated Vital Signs BP 131/76 (BP Location: Left Arm)   Pulse 72   Temp 97.8 F (36.6 C) (Oral)   Resp 18   LMP  (LMP Unknown)   SpO2 96%   Visual Acuity Right Eye Distance:   Left Eye Distance:   Bilateral Distance:    Right Eye Near:   Left Eye Near:    Bilateral Near:     Physical Exam Vitals and nursing note reviewed.  Constitutional:      General: She is not in acute distress.    Appearance: Normal appearance. She is not ill-appearing.  HENT:     Head: Normocephalic and atraumatic.     Right Ear: Tympanic membrane, ear canal and external ear normal. There is no impacted cerumen.     Left Ear: Tympanic membrane, ear canal and external ear normal. There is no impacted cerumen.     Nose: Congestion present. No rhinorrhea.     Mouth/Throat:     Mouth: Mucous membranes are moist.     Pharynx: Oropharynx is  clear. Posterior oropharyngeal erythema present.  Cardiovascular:  Rate and Rhythm: Normal rate and regular rhythm.     Pulses: Normal pulses.     Heart sounds: Normal heart sounds. No murmur heard.   No gallop.  Pulmonary:     Effort: Pulmonary effort is normal.     Breath sounds: Normal breath sounds. No wheezing, rhonchi or rales.  Chest:     Chest wall: No tenderness.  Musculoskeletal:     Cervical back: Normal range of motion and neck supple.     Right lower leg: Edema present.     Left lower leg: Edema present.  Lymphadenopathy:     Cervical: No cervical adenopathy.  Skin:    General: Skin is warm.     Capillary Refill: Capillary refill takes less than 2 seconds.     Findings: No erythema or rash.  Neurological:     General: No focal deficit present.     Mental Status: She is alert and oriented to person, place, and time.  Psychiatric:        Mood and Affect: Mood normal.        Behavior: Behavior normal.        Thought Content: Thought content normal.        Judgment: Judgment normal.     UC Treatments / Results  Labs (all labs ordered are listed, but only abnormal results are displayed) Labs Reviewed  SARS CORONAVIRUS 2 (TAT 6-24 HRS)    EKG   Radiology No results found.  Procedures Procedures (including critical care time)  Medications Ordered in UC Medications - No data to display  Initial Impression / Assessment and Plan / UC Course  I have reviewed the triage vital signs and the nursing notes.  Pertinent labs & imaging results that were available during my care of the patient were reviewed by me and considered in my medical decision making (see chart for details).  Patient is a pleasant and nontoxic-appearing 75 year old female here for evaluation of upper respiratory complaints and bilateral foot swelling as outlined in HPI above.  She states that she is also had some accompanying chest pressure for the last couple of months while she has had  the swelling in her feet.  The chest pressure is lower central upper epigastric area without radiation, shortness of breath, palpitations, dizziness, or syncope.  Patient has addresses with her PCP in the past and was told that probably in ulcer.  She has not had any hematemesis or dark tarry stools.  Patient's physical exam reveals pearly gray tympanic membranes bilaterally with normal light reflex and clear external auditory canals.  Nasal mucosa is erythematous and edematous without nasal discharge.  Oropharyngeal exam reveals mild posterior oropharyngeal erythema and injection with clear postnasal drip.  There is no cervical lymphadenopathy appreciated exam.  Cardiopulmonary exam reveals normal heart sounds that are S1-S2 and crisp, lung sounds are clear to auscultation all fields.  Patient's peripheral pulses are 2+ globally.  Patient has mild, 1+ edema in her feet and lower extremities to approximately the level of the tibial tuberosity.  Patient has no calf pain and is negative Bevelyn Buckles' sign on both lower extremities.  Suspect patient's edema is secondary to sedentary lifestyle and sitting all day at work.  Discussed that compression hose would be helpful to enhance venous return as well as getting up frequently from her desk to move around and help pump the fluid from her lower extremities back into her central circulation.  Will obtain EKG to evaluate the chest pain.  COVID  swab collected at triage.  Suspect patient's sore throat is coming from her postnasal drip.  EKG shows normal sinus rhythm with a ventricular rate of 64 bpm, PR interval 156 ms, QRS duration 90 ms, QT 442 ms, QTC 455 ms.  There is no ST elevation or depression noted on EKG.  EKG was compared to one from 05/20/2008 and there is no change.  Well discharge patient home with diagnosis of URI and treat with Atrovent nasal spray for the nasal congestion and postnasal drip.  We will also give Tessalon Perles for her intermittent cough.  I  suspect that her pedal edema is secondary to impaired venous return as result of a more sedentary lifestyle and the fact that she sits all day at her job.  I have encouraged the patient to purchase and wear compression hosiery to help improve venous return as well as get up more frequently from her desk at work every day and walk around to help pump the fluid from her lower extremities back into her central circulation.  Patient isolate at home pending the results of her COVID test.   Final Clinical Impressions(s) / UC Diagnoses   Final diagnoses:  Upper respiratory tract infection, unspecified type  Peripheral edema  URI with cough and congestion  Gastroesophageal reflux disease with esophagitis without hemorrhage     Discharge Instructions      Isolate at home pending the results of your COVID test.  If you test positive then you will have to quarantine for 5 days from the start of your symptoms.  After 5 days you can break quarantine if your symptoms have improved and you have not had a fever for 24 hours without taking Tylenol or ibuprofen.  Use over-the-counter Tylenol and ibuprofen as needed for body aches and fever.  Use the Tessalon Perles every 8 hours as needed for cough.  Use the Atrovent nasal spray, 2 squirts in each nostril every 6 hours, as needed for nasal congestion and postnasal drip.  For your chest pain: Resume taking your Prilosec 20 mg daily as this will cut down on acid production and hopefully help you with that chest pain.  There is no evidence of cardiac issues on your EKG today.  For your feet swelling: As we discussed, purchase and wear compression hosiery to help improve venous return of fluid from your legs.  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  The compression hose will help push the fluid back anterior central  circulation so that it can be pumped out of your legs.  Also, get up periodically throughout the day and walk around.  This, in conjunction with the compression hose, will help provide a pumping action and clear that fluid from your lower extremities.  If you develop any increased shortness of breath-especially at rest, you are unable to speak in full sentences, or is a late sign your lips are turning blue you need to go the ER for evaluation.      ED Prescriptions     Medication Sig Dispense Auth. Provider   benzonatate (TESSALON) 200 MG capsule Take 1 capsule (200 mg total) by mouth 2 (two) times daily as needed for cough. 20 capsule Margarette Canada, NP   omeprazole (PRILOSEC) 20 MG capsule Take 1 capsule (20 mg total) by mouth daily. 30 capsule Margarette Canada, NP  PDMP not reviewed this encounter.   Margarette Canada, NP 04/03/21 [phone]

## 2021-05-30 ENCOUNTER — Telehealth: Payer: Self-pay

## 2021-05-30 NOTE — Telephone Encounter (Signed)
Please advise 

## 2021-05-30 NOTE — Telephone Encounter (Signed)
Copied from [place] [id number]. Topic: General - Other >> May 30, 2021  9:46 AM Yvette Rack wrote: Reason for CRM: Pt stated she needs a Rx for allergy medication due to having sore throat and drainage. Pt stated she has taken 2 Covid tests at work and they both were negative. Pt requests that the Rx be sent to [facility], [place]

## 2021-06-06 ENCOUNTER — Encounter: Payer: Self-pay | Admitting: Family Medicine

## 2021-06-06 NOTE — Progress Notes (Deleted)
Complete physical exam   Patient: Yvette Rogers   DOB: [birth year]   75 y.o. Female  MRN: [id number] Visit Date: 06/06/2021  Today's healthcare provider: [name], MD   No chief complaint on file.  Subjective    Yvette Rogers is a 75 y.o. female who presents today for a complete physical exam.  She reports consuming a {diet types:17450} diet. {Exercise:19826} She generally feels {well/fairly well/poorly:18703}. She reports sleeping {well/fairly well/poorly:18703}. She {does/does not:200015} have additional problems to discuss today.  HPI  Patient had AWV with [facility] on 10/25/2020.  Past Medical History:  Diagnosis Date   Abnormal EKG    Anxiety    Arthritis    Cancer ([place])    skin ca   Chest pain    Depression    Shingles    Past Surgical History:  Procedure Laterality Date   BUNIONECTOMY Right    CHOLECYSTECTOMY     COLONOSCOPY     COLONOSCOPY WITH PROPOFOL N/A 10/13/2018   Procedure: COLONOSCOPY WITH PROPOFOL;  Surgeon: Manya Silvas, MD;  Location: [facility] ENDOSCOPY;  Service: Endoscopy;  Laterality: N/A;   EYE SURGERY Bilateral    Cataract removed   FOOT SURGERY     GALLBLADDER SURGERY     SKIN CANCER EXCISION     face   SKIN CANCER EXCISION Left 08/22/2013   left nostril area   subconjunctival     subconjunctival hemorrhage   Social History   Socioeconomic History   Marital status: Widowed    Spouse name: Not on file   Number of children: 1   Years of education: Not on file   Highest education level: 12th grade  Occupational History   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  Tobacco Use   Smoking status: Never   Smokeless tobacco: Never  Vaping Use   Vaping Use: Never used  Substance and Sexual Activity   Alcohol use: Yes    Comment: rarely every 6-12 months   Drug use: No   Sexual activity: Not on file  Other Topics Concern   Not on file  Social History Narrative   Not on file   Social Determinants of  Health   Financial Resource Strain: Low Risk    Difficulty of Paying Living Expenses: Not hard at all  Food Insecurity: No Food Insecurity   Worried About Charity fundraiser in the Last Year: Never true   [place] in the Last Year: Never true  Transportation Needs: No Transportation Needs   Lack of Transportation (Medical): No   Lack of Transportation (Non-Medical): No  Physical Activity: Inactive   Days of Exercise per Week: 0 days   Minutes of Exercise per Session: 0 min  Stress: Stress Concern Present   Feeling of Stress : To some extent  Social Connections: Moderately Isolated   Frequency of Communication with Friends and Family: More than three times a week   Frequency of Social Gatherings with Friends and Family: More than three times a week   Attends Religious Services: More than 4 times per year   Active Member of [employer] or Organizations: No   Attends Archivist Meetings: Never   Marital Status: Widowed  Human resources officer Violence: Not At Risk   Fear of Current or Ex-Partner: No   Emotionally Abused: No   Physically Abused: No   Sexually Abused: No   Family Status  Relation Name Status  Mother  Deceased at age 2       cause of death was pancreatic caner   Father  Deceased at age 2       cause of death was COPD   Sister  Alive   Brother  Deceased at age 14   Brother  [place]  Deceased   Family History  Problem Relation Age of Onset   Cancer Mother        pancreatic   Diabetes Mother    Kidney Stones Mother    COPD Father    Diabetes Father    Macular degeneration Father    Breast cancer Sister 25   Cancer Brother        Leukemia   Breast cancer Cousin        mat cousin   Allergies  Allergen Reactions   Bactrim [Sulfamethoxazole-Trimethoprim] Swelling   Penicillin V Potassium Hives   Penicillins Hives    Patient Care Team: Jerrol Banana., MD as PCP - General (Family Medicine) Anell Barr, OD  (Optometry)   Medications: Outpatient Medications Prior to Visit  Medication Sig   benzonatate (TESSALON) 200 MG capsule Take 1 capsule (200 mg total) by mouth 2 (two) times daily as needed for cough.   clonazePAM (KLONOPIN) 1 MG tablet Take 0.5-1 tablets (0.5-1 mg total) by mouth 2 (two) times daily as needed for anxiety.   meloxicam (MOBIC) 15 MG tablet TAKE 1 TABLET(15 MG) BY MOUTH DAILY   omeprazole (PRILOSEC) 20 MG capsule Take 1 capsule (20 mg total) by mouth daily.   traZODone (DESYREL) 150 MG tablet TAKE 1 TABLET(150 MG) BY MOUTH AT BEDTIME   No facility-administered medications prior to visit.    Review of Systems  Last CBC Lab Results  Component Value Date   WBC 4.3 03/26/2017   HGB 12.9 03/26/2017   HCT 37.5 03/26/2017   MCV 93 03/26/2017   MCH 31.9 03/26/2017   RDW 14.4 03/26/2017   PLT 252 02/72/5366   Last metabolic panel Lab Results  Component Value Date   GLUCOSE 92 03/26/2017   NA 142 03/26/2017   K 4.6 03/26/2017   CL 104 03/26/2017   CO2 25 03/26/2017   BUN 13 03/26/2017   CREATININE 0.78 03/26/2017   GFRNONAA 78 03/26/2017   GFRAA 90 03/26/2017   CALCIUM 9.3 03/26/2017   PROT 7.1 03/26/2017   ALBUMIN 4.2 03/26/2017   LABGLOB 2.9 03/26/2017   AGRATIO 1.4 03/26/2017   BILITOT 0.4 03/26/2017   ALKPHOS 96 03/26/2017   AST 27 03/26/2017   ALT 21 03/26/2017   Last lipids Lab Results  Component Value Date   CHOL 233 (H) 03/26/2017   HDL 56 03/26/2017   LDLCALC 137 (H) 03/26/2017   TRIG 200 (H) 03/26/2017   Last hemoglobin A1c Lab Results  Component Value Date   HGBA1C 5.1 11/09/2015   Last thyroid functions Lab Results  Component Value Date   TSH 2.010 03/26/2017      Objective    LMP  (LMP Unknown)  BP Readings from Last 3 Encounters:  04/03/21 131/76  12/13/20 (!) 168/93  12/10/20 131/84   Wt Readings from Last 3 Encounters:  12/13/20 201 lb 9.6 oz (91.4 kg)  02/01/20 184 lb 12.8 oz (83.8 kg)  10/13/18 175 lb (79.4 kg)       Physical Exam  ***  Last depression screening scores PHQ 2/9 Scores 12/10/2020 10/25/2020 01/31/2019  PHQ -  2 Score 4 4 4   PHQ- 9 Score 12 15 19    Last fall risk screening Fall Risk  12/10/2020  Falls in the past year? 1  Number falls in past yr: 1  Injury with Fall? 1  Risk for fall due to : Impaired balance/gait;Impaired mobility  Follow up Falls evaluation completed   Last Audit-C alcohol use screening Alcohol Use Disorder Test (AUDIT) 12/10/2020  1. How often do you have a drink containing alcohol? 1  2. How many drinks containing alcohol do you have on a typical day when you are drinking? 0  3. How often do you have six or more drinks on one occasion? 0  AUDIT-C Score 1  Alcohol Brief Interventions/Follow-up AUDIT Score <7 follow-up not indicated   A score of 3 or more in women, and 4 or more in men indicates increased risk for alcohol abuse, EXCEPT if all of the points are from question 1   No results found for any visits on 06/06/21.  Assessment & Plan    Routine Health Maintenance and Physical Exam  Exercise Activities and Dietary recommendations  Goals      DIET - INCREASE WATER INTAKE     Recommend to drink at least 6-8 8oz glasses of water per day.     Exercise 3x per week (30 min per time)     Recommend to start exercising 3 days a week for at least 30 minutes at a time.        Immunization History  Administered Date(s) Administered   Influenza, High Dose Seasonal PF 06/15/2020   Influenza, Seasonal, Injecte, Preservative Fre 07/10/2016   Influenza,inj,Quad PF,6+ Mos 07/03/2017, 06/10/2018   Moderna Sars-Covid-2 Vaccination 10/19/2019, 11/17/2019   PPD Test 12/11/2015, 12/25/2015   Pneumococcal Conjugate-13 09/06/2015   Pneumococcal Polysaccharide-23 09/25/2016   Tdap 02/28/2013   Zoster, Live 02/28/2013    Health Maintenance  Topic Date Due   Zoster Vaccines- Shingrix (1 of 2) Never done   COVID-19 Vaccine (3 - Booster for Moderna series)  04/18/2020   INFLUENZA VACCINE  04/15/2021   DEXA SCAN  10/25/2021 (Originally 07/29/2013)   MAMMOGRAM  06/14/2022   TETANUS/TDAP  03/01/2023   COLONOSCOPY (Pts 45-63yrs Insurance coverage will need to be confirmed)  10/14/2023   Hepatitis C Screening  Completed   HPV VACCINES  Aged Out    Discussed health benefits of physical activity, and encouraged her to engage in regular exercise appropriate for her age and condition.  ***  No follow-ups on file.     {provider attestation***:1}   [name], MD  [facility] [phone] (phone) [phone] (fax)  [place]

## 2021-06-28 ENCOUNTER — Ambulatory Visit: Payer: Self-pay | Admitting: *Deleted

## 2021-06-28 DIAGNOSIS — S8391XA Sprain of unspecified site of right knee, initial encounter: Secondary | ICD-10-CM | POA: Diagnosis not present

## 2021-06-28 NOTE — Telephone Encounter (Signed)
Patient is calling to report she fell 1 month ago and she hurt her R knee. Patient states she is having pain and swelling in knee and lower leg that is not getting better- causing her to limp at times. Call to office- advised [facility].

## 2021-06-28 NOTE — Telephone Encounter (Signed)
Reason for Disposition  [1] High-risk adult (e.g., age > 82 years, osteoporosis, chronic steroid use) AND [2] limping  Answer Assessment - Initial Assessment Questions 1. MECHANISM: "How did the injury happen?" (e.g., twisting injury, direct blow)      Patient tripped and fell working on flooring 2. ONSET: "When did the injury happen?" (Minutes or hours ago)      1 month 3. LOCATION: "Where is the injury located?"      R knee 4. APPEARANCE of INJURY: "What does the injury look like?"      Swelling- whole knee and leg 5. SEVERITY: "Can you put weight on that leg?" "Can you walk?"      Yes- wearing knee brace- limps at times 6. SIZE: For cuts, bruises, or swelling, ask: "How large is it?" (e.g., inches or centimeters;  entire joint)      Was bruised in the beginning- has gone away- below knee all around 7. PAIN: "Is there pain?" If Yes, ask: "How bad is the pain?"  "What does it keep you from doing?" (e.g., Scale 1-10; or mild, moderate, severe)   -  NONE: (0): no pain   -  MILD (1-3): doesn't interfere with normal activities    -  MODERATE (4-7): interferes with normal activities (e.g., work or school) or awakens from sleep, limping    -  SEVERE (8-10): excruciating pain, unable to do any normal activities, unable to walk     moderate 8. TETANUS: For any breaks in the skin, ask: "When was the last tetanus booster?"     N/a 9. OTHER SYMPTOMS: "Do you have any other symptoms?"  (e.g., "pop" when knee injured, swelling, locking, buckling)      Aching- when sitting now 10. PREGNANCY: "Is there any chance you are pregnant?" "When was your last menstrual period?"       N/a  Protocols used: Knee Injury-A-AH

## 2021-07-01 ENCOUNTER — Other Ambulatory Visit: Payer: Self-pay | Admitting: Family Medicine

## 2021-07-01 DIAGNOSIS — M25571 Pain in right ankle and joints of right foot: Secondary | ICD-10-CM

## 2021-07-01 DIAGNOSIS — M25572 Pain in left ankle and joints of left foot: Secondary | ICD-10-CM

## 2021-07-01 NOTE — Telephone Encounter (Signed)
Noted  

## 2021-07-01 NOTE — Telephone Encounter (Signed)
Requested medication (s) are due for refill today -yes  Requested medication (s) are on the active medication list -yes  Future visit scheduled -no  Last refill: 03/26/21 #90  Notes to clinic: Request RF: fails lab protocol- last lab-03/26/17  Requested Prescriptions  Pending Prescriptions Disp Refills   meloxicam (MOBIC) 15 MG tablet [Pharmacy Med Name: MELOXICAM 15MG  TABLETS] 90 tablet 0    Sig: TAKE 1 TABLET(15 MG) BY MOUTH DAILY     Analgesics:  COX2 Inhibitors Failed - 07/01/2021  7:09 AM      Failed - HGB in normal range and within 360 days    Hemoglobin  Date Value Ref Range Status  03/26/2017 12.9 11.1 - 15.9 g/dL Final          Failed - Cr in normal range and within 360 days    Creatinine, Ser  Date Value Ref Range Status  03/26/2017 0.78 0.57 - 1.00 mg/dL Final          Passed - Patient is not pregnant      Passed - Valid encounter within last 12 months    Recent Outpatient Visits           6 months ago Moderate episode of recurrent major depressive disorder ([place])   [place] Jerrol Banana., MD   1 year ago Leg swelling   [facility] Carles Collet M, [place]   2 years ago Acute gastritis without hemorrhage, unspecified gastritis type   [facility] Jerrol Banana., MD   2 years ago Reactive depression   [facility] Jerrol Banana., MD   2 years ago URI with cough and congestion   [facility] Chrismon, Vickki Muff, [place]                 Requested Prescriptions  Pending Prescriptions Disp Refills   meloxicam (MOBIC) 15 MG tablet [Pharmacy Med Name: MELOXICAM 15MG  TABLETS] 90 tablet 0    Sig: TAKE 1 TABLET(15 MG) BY MOUTH DAILY     Analgesics:  COX2 Inhibitors Failed - 07/01/2021  7:09 AM      Failed - HGB in normal range and within 360 days    Hemoglobin  Date Value Ref Range Status  03/26/2017 12.9 11.1 - 15.9 g/dL Final          Failed - Cr in  normal range and within 360 days    Creatinine, Ser  Date Value Ref Range Status  03/26/2017 0.78 0.57 - 1.00 mg/dL Final          Passed - Patient is not pregnant      Passed - Valid encounter within last 12 months    Recent Outpatient Visits           6 months ago Moderate episode of recurrent major depressive disorder [facility])   [facility] Jerrol Banana., MD   1 year ago Leg swelling   [facility] Carles Collet M, [place]   2 years ago Acute gastritis without hemorrhage, unspecified gastritis type   [facility] Jerrol Banana., MD   2 years ago Reactive depression   [facility] Jerrol Banana., MD   2 years ago URI with cough and congestion   [facility] Chrismon, Vickki Muff, [place]

## 2021-09-24 ENCOUNTER — Ambulatory Visit
Admission: EM | Admit: 2021-09-24 | Discharge: 2021-09-24 | Disposition: A | Discharge status: Home / Self Care | Payer: PPO | Attending: Internal Medicine | Admitting: Internal Medicine

## 2021-09-24 ENCOUNTER — Encounter: Payer: Self-pay | Admitting: Licensed Clinical Social Worker

## 2021-09-24 ENCOUNTER — Other Ambulatory Visit: Payer: Self-pay

## 2021-09-24 DIAGNOSIS — R0981 Nasal congestion: Secondary | ICD-10-CM | POA: Insufficient documentation

## 2021-09-24 DIAGNOSIS — B349 Viral infection, unspecified: Secondary | ICD-10-CM | POA: Diagnosis not present

## 2021-09-24 DIAGNOSIS — J029 Acute pharyngitis, unspecified: Secondary | ICD-10-CM | POA: Diagnosis present

## 2021-09-24 DIAGNOSIS — R051 Acute cough: Secondary | ICD-10-CM | POA: Diagnosis not present

## 2021-09-24 DIAGNOSIS — Z20822 Contact with and (suspected) exposure to covid-19: Secondary | ICD-10-CM | POA: Diagnosis not present

## 2021-09-24 LAB — RESP PANEL BY RT-PCR (FLU A&B, COVID) ARPGX2
Influenza A by PCR: NEGATIVE
Influenza B by PCR: NEGATIVE
SARS Coronavirus 2 by RT PCR: NEGATIVE

## 2021-09-24 MED ORDER — FLUTICASONE PROPIONATE 50 MCG/ACT NA SUSP: 2.0000 | Freq: Every day | NASAL | 0 refills | Status: DC

## 2021-09-24 MED ORDER — BENZONATATE 200 MG PO CAPS: 200.0000 mg | ORAL_CAPSULE | Freq: Three times a day (TID) | ORAL | 0 refills | Status: DC | PRN

## 2021-09-24 NOTE — ED Provider Notes (Signed)
[facility]    CSN: [id number] Arrival date & time: 09/24/21  1135      History   Chief Complaint Chief Complaint  Patient presents with   Sore Throat   Cough    HPI Yvette Rogers is a 76 y.o. female.  She presents today with a couple days history of hoarseness, scratchy throat, malaise.  She has dry cough, feels like her sinuses might be a little congested.  This started in the last 2 to 3 days.  No fever, not vomiting, may be a little bit of loose stool.  Her coworkers were concerned about [place].  She took a COVID test yesterday at home that was negative.  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   [place] (subconjunctival hemorrhage) 03/23/2015   Tenosynovitis 03/23/2015   Variants of migraine 09/13/2009   Adjustment disorder with mixed anxiety and depressed mood 06/11/2009    Past Surgical History:  Procedure Laterality Date   BUNIONECTOMY Right    CHOLECYSTECTOMY     COLONOSCOPY     COLONOSCOPY WITH PROPOFOL N/A 10/13/2018   Procedure: COLONOSCOPY WITH PROPOFOL;  Surgeon: Manya Silvas, MD;  Location: [facility] ENDOSCOPY;  Service: Endoscopy;  Laterality: N/A;   EYE SURGERY Bilateral    Cataract removed   FOOT SURGERY     GALLBLADDER SURGERY     SKIN CANCER EXCISION     face   SKIN CANCER EXCISION Left 08/22/2013   left nostril area   subconjunctival     subconjunctival hemorrhage     Home Medications    Prior to Admission medications   Medication Sig Start Date End Date Taking? Authorizing Provider  benzonatate (TESSALON) 200 MG capsule Take 1  capsule (200 mg total) by mouth 3 (three) times daily as needed for cough. 09/24/21  Yes Wynona Luna, MD  fluticasone [facility]) 50 MCG/ACT nasal spray Place 2 sprays into both nostrils daily. 09/24/21  Yes Wynona Luna, MD  clonazePAM (KLONOPIN) 1 MG tablet Take 0.5-1 tablets (0.5-1 mg total) by mouth 2 (two) times daily as needed for anxiety. 12/23/20   Jerrol Banana., MD  meloxicam (MOBIC) 15 MG tablet TAKE 1 TABLET(15 MG) BY MOUTH DAILY 07/02/21   Jerrol Banana., MD  traZODone (DESYREL) 150 MG tablet TAKE 1 TABLET(150 MG) BY MOUTH AT BEDTIME 12/23/20   Jerrol Banana., MD    Family History Family History  Problem Relation Age of Onset   Cancer Mother        pancreatic   Diabetes Mother    Kidney Stones Mother    COPD Father    Diabetes Father    Macular degeneration Father    Breast cancer Sister 58   Cancer Brother        Leukemia   Breast cancer Cousin        mat cousin    Social History Social History   Tobacco Use   Smoking status: Never   Smokeless tobacco: Never  Vaping Use   Vaping Use: Never used  Substance Use Topics   Alcohol use: Yes    Comment: rarely every 6-12 months   Drug use: No     Allergies   Bactrim [sulfamethoxazole-trimethoprim], Penicillin v potassium, and Penicillins   Review of Systems Review of Systems see HPI   Physical Exam Triage Vital Signs ED Triage Vitals  Enc Vitals Group     BP 09/24/21 1207 (!) 148/70     Pulse Rate 09/24/21 1207 86     Resp 09/24/21 1207 16     Temp 09/24/21 1207 97.8 F (36.6 C)     Temp Source 09/24/21 1207 Oral     SpO2 09/24/21 1207 99 %     Weight 09/24/21 1205 201 lb 8 oz (91.4 kg)     Height 09/24/21 1205 5' 5.5" (1.664 m)     Pain Score 09/24/21 1205 4     Pain Loc --    Updated Vital Signs BP (!) 148/70 (BP Location: Left Arm)    Pulse 86    Temp 97.8 F (36.6 C) (Oral)    Resp 16    Ht 5' 5.5" (1.664 m)    Wt 91.4 kg    LMP  (LMP Unknown)    SpO2 99%     BMI 33.02 kg/m   Physical Exam Constitutional:      General: She is not in acute distress.    Appearance: She is ill-appearing. She is not toxic-appearing.     Comments: Nicely groomed.  Looks like she does not feel well  HENT:     Head: Atraumatic.     Comments: Bilateral TMs are dull, no erythema Mild to moderate nasal congestion Posterior pharynx is mildly injected    Mouth/Throat:     Mouth: Mucous membranes are moist.  Cardiovascular:     Rate and Rhythm: Normal rate and regular rhythm.  Pulmonary:     Effort: Pulmonary effort is normal. No respiratory distress.     Breath sounds: No wheezing or rhonchi.  Abdominal:     General: There is no distension.  Musculoskeletal:     Cervical back: Neck supple.     Comments: Walked into the urgent care independently  Skin:    General: Skin is warm and dry.     Comments: Pink, no cyanosis  Neurological:     Mental Status: She is alert.     Comments: Face is symmetric, speech is clear, coherent, logical     UC Treatments / Results  Labs (all labs ordered are listed, but only abnormal results are displayed) Labs Reviewed  RESP PANEL BY RT-PCR (FLU A&B, COVID) [id number]  Tests for covid, flu A&B negative.  Notified patient by phone of results after discharge.  EKG N/A  Radiology No results found. N/A  Procedures Procedures (including critical care time) N/A  Medications Ordered in UC Medications - No data to display N/A  Initial Impression / Assessment and Plan / UC Course  Differential diagnosis includes viral respiratory infections including covid and influenza, other viruses, less likely bacterial pneumonia or CHF given absence of focal lung exam findings and reassuring vital signs.  Supportive measures to manage bothersome symptoms, observe for improvement.    Final Clinical Impressions(s) / UC Diagnoses   Final diagnoses:  Acute viral syndrome  Sinus congestion  Acute cough     Discharge Instructions       Symptoms today seem most consistent with a respiratory viral infection.  This  could be covid, despite a negative home test yesterday.  A covid/flu test was done at the urgent care today and results are pending.  You will be contacted if the results suggest a change in treatment.  Prescriptions for fluticasone nasal steroid spray (for congestion) and for benzonatate (for cough) were sent to the pharmacy.  Push fluids and rest.  Take tylenol or advil otc as needed for fever, discomfort.  Eat fruits and vegetables to help your immune system do its best work.  Anticipate gradual improvement over the next several days.  Recheck for new fever >100.5, increasing phlegm production/nasal discharge, or if not starting to improve in a few days.      ED Prescriptions     Medication Sig Dispense Auth. Provider   fluticasone (FLONASE) 50 MCG/ACT nasal spray Place 2 sprays into both nostrils daily. 16 g Wynona Luna, MD   benzonatate (TESSALON) 200 MG capsule Take 1 capsule (200 mg total) by mouth 3 (three) times daily as needed for cough. 30 capsule Wynona Luna, MD      PDMP not reviewed this encounter.   Wynona Luna, MD 09/25/21 1052

## 2021-09-24 NOTE — Discharge Instructions (Addendum)
Symptoms today seem most consistent with a respiratory viral infection.  This could be covid, despite a negative home test yesterday.  A covid/flu test was done at the urgent care today and results are pending.  You will be contacted if the results suggest a change in treatment.  Prescriptions for fluticasone nasal steroid spray (for congestion) and for benzonatate (for cough) were sent to the pharmacy.  Push fluids and rest.  Take tylenol or advil otc as needed for fever, discomfort.  Eat fruits and vegetables to help your immune system do its best work.  Anticipate gradual improvement over the next several days.  Recheck for new fever >100.5, increasing phlegm production/nasal discharge, or if not starting to improve in a few days.

## 2021-09-24 NOTE — ED Triage Notes (Signed)
Pt c/o sore throat, chest tightness, and cough sxs x 2 days. Covid test negative at home today.

## 2021-10-29 DIAGNOSIS — M7550 Bursitis of unspecified shoulder: Secondary | ICD-10-CM | POA: Insufficient documentation

## 2021-10-29 DIAGNOSIS — M1710 Unilateral primary osteoarthritis, unspecified knee: Secondary | ICD-10-CM | POA: Insufficient documentation

## 2021-11-06 ENCOUNTER — Ambulatory Visit (INDEPENDENT_AMBULATORY_CARE_PROVIDER_SITE_OTHER): Payer: PPO

## 2021-11-06 DIAGNOSIS — Z780 Asymptomatic menopausal state: Secondary | ICD-10-CM

## 2021-11-06 DIAGNOSIS — Z0000 Encounter for general adult medical examination without abnormal findings: Secondary | ICD-10-CM

## 2021-11-06 DIAGNOSIS — Z1231 Encounter for screening mammogram for malignant neoplasm of breast: Secondary | ICD-10-CM

## 2021-11-06 NOTE — Progress Notes (Signed)
Virtual Visit via Telephone Note  I connected with  Yvette Rogers on 11/06/21 at  3:40 PM EST by telephone and verified that I am speaking with the correct person using two identifiers.  Location: Patient: home Provider: [facility] Persons participating in the virtual visit: [place]   I discussed the limitations, risks, security and privacy concerns of performing an evaluation and management service by telephone and the availability of in person appointments. The patient expressed understanding and agreed to proceed.  Interactive audio and video telecommunications were attempted between this nurse and patient, however failed, due to patient having technical difficulties OR patient did not have access to video capability.  We continued and completed visit with audio only.  Some vital signs may be absent or patient reported.   Dionisio David, LPN  Subjective:   Yvette Rogers is a 76 y.o. female who presents for Medicare Annual (Subsequent) preventive examination.  Review of Systems           Objective:    There were no vitals filed for this visit. There is no height or weight on file to calculate BMI.  Advanced Directives 10/25/2020 01/31/2019 10/13/2018 01/14/2018 01/10/2018 09/25/2016 05/18/2016  Does Patient Have a Medical Advance Directive? Yes No No No No Yes No  Type of Paramedic of [place];Living will - - - - - -  Copy of [place] in Chart? Yes - validated most recent copy scanned in chart (See row information) - No - copy requested - - - -  Would patient like information on creating a medical advance directive? - Yes (ED - Information included in AVS) - Yes (MAU/Ambulatory/Procedural Areas - Information given) No - Patient declined - No - patient declined information    Current Medications (verified) Outpatient Encounter Medications as of 11/06/2021  Medication Sig   ALPRAZolam (XANAX) 0.25 MG tablet alprazolam 0.25 mg  tablet   ALPRAZolam (XANAX) 0.5 MG tablet alprazolam 0.5 mg tablet   benzonatate (TESSALON) 200 MG capsule Take 1 capsule (200 mg total) by mouth 3 (three) times daily as needed for cough.   buPROPion (WELLBUTRIN XL) 300 MG 24 hr tablet bupropion HCl XL 300 mg 24 hr tablet, extended release   clonazePAM (KLONOPIN) 1 MG tablet Take 0.5-1 tablets (0.5-1 mg total) by mouth 2 (two) times daily as needed for anxiety.   diazepam (VALIUM) 5 MG tablet diazepam 5 mg tablet   etodolac (LODINE) 500 MG tablet etodolac 500 mg tablet  take 1 tablet by mouth twice a day with food   fluticasone (FLONASE) 50 MCG/ACT nasal spray Place 2 sprays into both nostrils daily.   meloxicam (MOBIC) 15 MG tablet TAKE 1 TABLET(15 MG) BY MOUTH DAILY   meloxicam (MOBIC) 7.5 MG tablet meloxicam 7.5 mg tablet   oxyCODONE-acetaminophen (PERCOCET) 10-325 MG tablet oxycodone-acetaminophen 10 mg-325 mg tablet   prednisoLONE acetate (PRED FORTE) 1 % ophthalmic suspension prednisolone acetate 1 % eye drops,suspension   promethazine (PHENERGAN) 25 MG tablet promethazine 25 mg tablet   sertraline (ZOLOFT) 100 MG tablet sertraline 100 mg tablet   sertraline (ZOLOFT) 50 MG tablet sertraline 50 mg tablet   traZODone (DESYREL) 150 MG tablet TAKE 1 TABLET(150 MG) BY MOUTH AT BEDTIME   Venlafaxine HCl 150 MG TB24 venlafaxine ER 150 mg tablet,extended release 24 hr   Venlafaxine HCl 225 MG TB24 venlafaxine ER 225 mg tablet,extended release 24 hr   No facility-administered encounter medications on file as of 11/06/2021.  Allergies (verified) Bactrim [sulfamethoxazole-trimethoprim], Penicillin v potassium, and Penicillins   History: Past Medical History:  Diagnosis Date   Abnormal EKG    Anxiety    Arthritis    Cancer ([place])    skin ca   Chest pain    Depression    Shingles    Past Surgical History:  Procedure Laterality Date   BUNIONECTOMY Right    CHOLECYSTECTOMY     COLONOSCOPY     COLONOSCOPY WITH PROPOFOL N/A 10/13/2018    Procedure: COLONOSCOPY WITH PROPOFOL;  Surgeon: Manya Silvas, MD;  Location: [facility] ENDOSCOPY;  Service: Endoscopy;  Laterality: N/A;   EYE SURGERY Bilateral    Cataract removed   FOOT SURGERY     GALLBLADDER SURGERY     SKIN CANCER EXCISION     face   SKIN CANCER EXCISION Left 08/22/2013   left nostril area   subconjunctival     subconjunctival hemorrhage   Family History  Problem Relation Age of Onset   Cancer Mother        pancreatic   Diabetes Mother    Kidney Stones Mother    COPD Father    Diabetes Father    Macular degeneration Father    Breast cancer Sister 25   Cancer Brother        Leukemia   Breast cancer Cousin        mat cousin   Social History   Socioeconomic History   Marital status: Widowed    Spouse name: Not on file   Number of children: 1   Years of education: Not on file   Highest education level: 12th grade  Occupational History   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  Tobacco Use   Smoking status: Never   Smokeless tobacco: Never  Vaping Use   Vaping Use: Never used  Substance and Sexual Activity   Alcohol use: Yes    Comment: rarely every 6-12 months   Drug use: No   Sexual activity: Not on file  Other Topics Concern   Not on file  Social History Narrative   Not on file   Social Determinants of Health   Financial Resource Strain: Not on file  Food Insecurity: Not on file  Transportation Needs: Not on file  Physical Activity: Not on file  Stress: Not on file  Social Connections: Not on file    Tobacco Counseling Counseling given: Not Answered   Clinical Intake:  Pre-visit preparation completed: Yes  Pain : No/denies pain     Nutritional Risks: None Diabetes: No  How often do you need to have someone help you when you read instructions, pamphlets, or other written materials from your doctor or pharmacy?: 1 - Never  Diabetic?no  Interpreter Needed?: No  Information entered by ::  Kirke Shaggy, LPN   Activities of Daily Living In your present state of health, do you have any difficulty performing the following activities: 12/10/2020  Hearing? N  Vision? N  Difficulty concentrating or making decisions? N  Walking or climbing stairs? N  Dressing or bathing? N  Doing errands, shopping? N  Some recent data might be hidden    Patient Care Team: Jerrol Banana., MD as PCP - General (Family Medicine) Anell Barr, OD (Optometry)  Indicate any recent Medical Services you may have received from other than [facility] providers in the past year (date may be approximate).     Assessment:   This is a  routine wellness examination for [place].  Hearing/Vision screen No results found.  Dietary issues and exercise activities discussed:     Goals Addressed   None    Depression Screen PHQ 2/9 Scores 12/10/2020 10/25/2020 01/31/2019 01/14/2018 11/17/2017 03/26/2017 03/26/2017  PHQ - 2 Score 4 4 4 4 3 4 4   PHQ- 9 Score 12 15 19 14 11 14 14     Fall Risk Fall Risk  12/10/2020 10/25/2020 01/31/2019 01/14/2018 11/17/2017  Falls in the past year? 1 0 1 No No  Number falls in past yr: 1 0 0 - -  Injury with Fall? 1 0 0 - -  Risk for fall due to : Impaired balance/gait;Impaired mobility - - - -  Follow up Falls evaluation completed - Falls prevention discussed - -    FALL RISK PREVENTION PERTAINING TO THE HOME:  Any stairs in or around the home? No  If so, are there any without handrails? No  Home free of loose throw rugs in walkways, pet beds, electrical cords, etc? Yes  Adequate lighting in your home to reduce risk of falls? Yes   ASSISTIVE DEVICES UTILIZED TO PREVENT FALLS:  Life alert? No  Use of a cane, walker or w/c? No  Grab bars in the bathroom? No  Shower chair or bench in shower? No  Elevated toilet seat or a handicapped toilet? No   Cognitive Function:    6CIT Screen 09/25/2016  What Year? 0 points  What month? 0 points  What time? 0 points  Count back from  20 0 points  Months in reverse 0 points  Repeat phrase 2 points  Total Score 2    Immunizations Immunization History  Administered Date(s) Administered   Influenza, High Dose Seasonal PF 06/15/2020   Influenza, Seasonal, Injecte, Preservative Fre 07/10/2016   Influenza,inj,Quad PF,6+ Mos 07/03/2017, 06/10/2018   Moderna Sars-Covid-2 Vaccination 10/19/2019, 11/17/2019   PPD Test 12/11/2015, 12/25/2015   Pneumococcal Conjugate-13 09/06/2015   Pneumococcal Polysaccharide-23 09/25/2016   Tdap 02/28/2013   Zoster, Live 02/28/2013    TDAP status: Due, Education has been provided regarding the importance of this vaccine. Advised may receive this vaccine at local pharmacy or Health Dept. Aware to provide a copy of the vaccination record if obtained from local pharmacy or Health Dept. Verbalized acceptance and understanding.  Flu Vaccine status: Up to date  Pneumococcal vaccine status: Up to date  Covid-19 vaccine status: Completed vaccines  Qualifies for Shingles Vaccine? Yes   Zostavax completed Yes   Shingrix Completed?: No.    Education has been provided regarding the importance of this vaccine. Patient has been advised to call insurance company to determine out of pocket expense if they have not yet received this vaccine. Advised may also receive vaccine at local pharmacy or Health Dept. Verbalized acceptance and understanding.  Screening Tests Health Maintenance  Topic Date Due   Zoster Vaccines- Shingrix (1 of 2) Never done   DEXA SCAN  Never done   COVID-19 Vaccine (3 - Booster for Moderna series) 01/12/2020   INFLUENZA VACCINE  04/15/2021   MAMMOGRAM  06/14/2022   TETANUS/TDAP  03/01/2023   COLONOSCOPY (Pts 45-81yrs Insurance coverage will need to be confirmed)  10/14/2023   Pneumonia Vaccine 51+ Years old  Completed   Hepatitis C Screening  Completed   HPV VACCINES  Aged Out    Health Maintenance  Health Maintenance Due  Topic Date Due   Zoster Vaccines- Shingrix  (1 of 2) Never done   DEXA SCAN  Never done   COVID-19 Vaccine (3 - Booster for Moderna series) 01/12/2020   INFLUENZA VACCINE  04/15/2021    Colorectal cancer screening: Type of screening: Colonoscopy. Completed 10/13/18. Repeat every 5 years  Mammogram status: Ordered referral sent today. Pt provided with contact info and advised to call to schedule appt.   Bone Density status: Ordered referral sent today. Pt provided with contact info and advised to call to schedule appt.  Lung Cancer Screening: (Low Dose CT Chest recommended if Age 56-80 years, 30 pack-year currently smoking OR have quit w/in 15years.) does not qualify.   Additional Screening:  Hepatitis C Screening: does qualify; Completed 03/26/17  Vision Screening: Recommended annual ophthalmology exams for early detection of glaucoma and other disorders of the eye. Is the patient up to date with their annual eye exam?  Yes  Who is the provider or what is the name of the office in which the patient attends annual eye exams? Dr.Woodard If pt is not established with a provider, would they like to be referred to a provider to establish care? No .   Dental Screening: Recommended annual dental exams for proper oral hygiene  Community Resource Referral / Chronic Care Management: CRR required this visit?  No   CCM required this visit?  No      Plan:     I have personally reviewed and noted the following in the patient's chart:   Medical and social history Use of alcohol, tobacco or illicit drugs  Current medications and supplements including opioid prescriptions.  Functional ability and status Nutritional status Physical activity Advanced directives List of other physicians Hospitalizations, surgeries, and ER visits in previous 12 months Vitals Screenings to include cognitive, depression, and falls Referrals and appointments  In addition, I have reviewed and discussed with patient certain preventive protocols, quality  metrics, and best practice recommendations. A written personalized care plan for preventive services as well as general preventive health recommendations were provided to patient.     Dionisio David, LPN   4/31/5400   Nurse Notes: none

## 2021-11-06 NOTE — Patient Instructions (Signed)
[name] , Thank you for taking time to come for your Medicare Wellness Visit. I appreciate your ongoing commitment to your health goals. Please review the following plan we discussed and let me know if I can assist you in the future.   Screening recommendations/referrals: Colonoscopy:10/13/18  Mammogram: referral sent Bone Density: referral sent Recommended yearly ophthalmology/optometry visit for glaucoma screening and checkup Recommended yearly dental visit for hygiene and checkup  Vaccinations: Influenza vaccine: had shot at work this season Pneumococcal vaccine: 09/25/16 Tdap vaccine: 02/28/13 Shingles vaccine: Zostavax 02/28/13   Covid-19:10/19/19, 11/17/19  Advanced directives: no  Conditions/risks identified: none  Next appointment: Follow up in one year for your annual wellness visit 11/10/22 @ 1:40pm by phone   Preventive Care 65 Years and Older, Female Preventive care refers to lifestyle choices and visits with your health care provider that can promote health and wellness. What does preventive care include? A yearly physical exam. This is also called an annual well check. Dental exams once or twice a year. Routine eye exams. Ask your health care provider how often you should have your eyes checked. Personal lifestyle choices, including: Daily care of your teeth and gums. Regular physical activity. Eating a healthy diet. Avoiding tobacco and drug use. Limiting alcohol use. Practicing safe sex. Taking low-dose aspirin every day. Taking vitamin and mineral supplements as recommended by your health care provider. What happens during an annual well check? The services and screenings done by your health care provider during your annual well check will depend on your age, overall health, lifestyle risk factors, and family history of disease. Counseling  Your health care provider may ask you questions about your: Alcohol use. Tobacco use. Drug use. Emotional  well-being. Home and relationship well-being. Sexual activity. Eating habits. History of falls. Memory and ability to understand (cognition). Work and work Statistician. Reproductive health. Screening  You may have the following tests or measurements: Height, weight, and BMI. Blood pressure. Lipid and cholesterol levels. These may be checked every 5 years, or more frequently if you are over 14 years old. Skin check. Lung cancer screening. You may have this screening every year starting at age 14 if you have a 30-pack-year history of smoking and currently smoke or have quit within the past 15 years. Fecal occult blood test (FOBT) of the stool. You may have this test every year starting at age 44. Flexible sigmoidoscopy or colonoscopy. You may have a sigmoidoscopy every 5 years or a colonoscopy every 10 years starting at age 59. Hepatitis C blood test. Hepatitis B blood test. Sexually transmitted disease (STD) testing. Diabetes screening. This is done by checking your blood sugar (glucose) after you have not eaten for a while (fasting). You may have this done every 1-3 years. Bone density scan. This is done to screen for osteoporosis. You may have this done starting at age 59. Mammogram. This may be done every 1-2 years. Talk to your health care provider about how often you should have regular mammograms. Talk with your health care provider about your test results, treatment options, and if necessary, the need for more tests. Vaccines  Your health care provider may recommend certain vaccines, such as: Influenza vaccine. This is recommended every year. Tetanus, diphtheria, and acellular pertussis (Tdap, Td) vaccine. You may need a Td booster every 10 years. Zoster vaccine. You may need this after age 44. Pneumococcal 13-valent conjugate (PCV13) vaccine. One dose is recommended after age 45. Pneumococcal polysaccharide (PPSV23) vaccine. One dose is recommended after age  103. Talk to your  health care provider about which screenings and vaccines you need and how often you need them. This information is not intended to replace advice given to you by your health care provider. Make sure you discuss any questions you have with your health care provider. Document Released: 09/28/2015 Document Revised: 05/21/2016 Document Reviewed: 07/03/2015 Elsevier Interactive Patient Education  2017 [place] Prevention in the Home Falls can cause injuries. They can happen to people of all ages. There are many things you can do to make your home safe and to help prevent falls. What can I do on the outside of my home? Regularly fix the edges of walkways and driveways and fix any cracks. Remove anything that might make you trip as you walk through a door, such as a raised step or threshold. Trim any bushes or trees on the path to your home. Use bright outdoor lighting. Clear any walking paths of anything that might make someone trip, such as rocks or tools. Regularly check to see if handrails are loose or broken. Make sure that both sides of any steps have handrails. Any raised decks and porches should have guardrails on the edges. Have any leaves, snow, or ice cleared regularly. Use sand or salt on walking paths during winter. Clean up any spills in your garage right away. This includes oil or grease spills. What can I do in the bathroom? Use night lights. Install grab bars by the toilet and in the tub and shower. Do not use towel bars as grab bars. Use non-skid mats or decals in the tub or shower. If you need to sit down in the shower, use a plastic, non-slip stool. Keep the floor dry. Clean up any water that spills on the floor as soon as it happens. Remove soap buildup in the tub or shower regularly. Attach bath mats securely with double-sided non-slip rug tape. Do not have throw rugs and other things on the floor that can make you trip. What can I do in the bedroom? Use night  lights. Make sure that you have a light by your bed that is easy to reach. Do not use any sheets or blankets that are too big for your bed. They should not hang down onto the floor. Have a firm chair that has side arms. You can use this for support while you get dressed. Do not have throw rugs and other things on the floor that can make you trip. What can I do in the kitchen? Clean up any spills right away. Avoid walking on wet floors. Keep items that you use a lot in easy-to-reach places. If you need to reach something above you, use a strong step stool that has a grab bar. Keep electrical cords out of the way. Do not use floor polish or wax that makes floors slippery. If you must use wax, use non-skid floor wax. Do not have throw rugs and other things on the floor that can make you trip. What can I do with my stairs? Do not leave any items on the stairs. Make sure that there are handrails on both sides of the stairs and use them. Fix handrails that are broken or loose. Make sure that handrails are as long as the stairways. Check any carpeting to make sure that it is firmly attached to the stairs. Fix any carpet that is loose or worn. Avoid having throw rugs at the top or bottom of the stairs. If you do have  throw rugs, attach them to the floor with carpet tape. Make sure that you have a light switch at the top of the stairs and the bottom of the stairs. If you do not have them, ask someone to add them for you. What else can I do to help prevent falls? Wear shoes that: Do not have high heels. Have rubber bottoms. Are comfortable and fit you well. Are closed at the toe. Do not wear sandals. If you use a stepladder: Make sure that it is fully opened. Do not climb a closed stepladder. Make sure that both sides of the stepladder are locked into place. Ask someone to hold it for you, if possible. Clearly mark and make sure that you can see: Any grab bars or handrails. First and last  steps. Where the edge of each step is. Use tools that help you move around (mobility aids) if they are needed. These include: Canes. Walkers. Scooters. Crutches. Turn on the lights when you go into a dark area. Replace any light bulbs as soon as they burn out. Set up your furniture so you have a clear path. Avoid moving your furniture around. If any of your floors are uneven, fix them. If there are any pets around you, be aware of where they are. Review your medicines with your doctor. Some medicines can make you feel dizzy. This can increase your chance of falling. Ask your doctor what other things that you can do to help prevent falls. This information is not intended to replace advice given to you by your health care provider. Make sure you discuss any questions you have with your health care provider. Document Released: 06/28/2009 Document Revised: 02/07/2016 Document Reviewed: 10/06/2014 Elsevier Interactive Patient Education  2017 [employer].

## 2021-12-16 DIAGNOSIS — H26499 Other secondary cataract, unspecified eye: Secondary | ICD-10-CM | POA: Diagnosis not present

## 2021-12-16 DIAGNOSIS — H1045 Other chronic allergic conjunctivitis: Secondary | ICD-10-CM | POA: Diagnosis not present

## 2021-12-16 DIAGNOSIS — H35039 Hypertensive retinopathy, unspecified eye: Secondary | ICD-10-CM | POA: Diagnosis not present

## 2021-12-16 DIAGNOSIS — H353131 Nonexudative age-related macular degeneration, bilateral, early dry stage: Secondary | ICD-10-CM | POA: Diagnosis not present

## 2021-12-16 DIAGNOSIS — H5213 Myopia, bilateral: Secondary | ICD-10-CM | POA: Diagnosis not present

## 2022-01-13 ENCOUNTER — Ambulatory Visit
Admission: RE | Admit: 2022-01-13 | Discharge: 2022-01-13 | Disposition: A | Discharge status: Home / Self Care | Payer: PPO | Source: Ambulatory Visit | Attending: Family Medicine | Admitting: Family Medicine

## 2022-01-13 DIAGNOSIS — Z1231 Encounter for screening mammogram for malignant neoplasm of breast: Secondary | ICD-10-CM | POA: Insufficient documentation

## 2022-01-13 DIAGNOSIS — Z780 Asymptomatic menopausal state: Secondary | ICD-10-CM | POA: Diagnosis not present

## 2022-01-13 DIAGNOSIS — M85851 Other specified disorders of bone density and structure, right thigh: Secondary | ICD-10-CM | POA: Diagnosis not present

## 2022-05-29 ENCOUNTER — Encounter: Payer: Self-pay | Admitting: Family Medicine

## 2022-05-29 ENCOUNTER — Ambulatory Visit (INDEPENDENT_AMBULATORY_CARE_PROVIDER_SITE_OTHER): Payer: PPO | Admitting: Family Medicine

## 2022-05-29 VITALS — BP 127/56 | HR 95 | Resp 16

## 2022-05-29 DIAGNOSIS — R1011 Right upper quadrant pain: Secondary | ICD-10-CM

## 2022-05-29 DIAGNOSIS — G8929 Other chronic pain: Secondary | ICD-10-CM | POA: Diagnosis not present

## 2022-05-29 DIAGNOSIS — M25561 Pain in right knee: Secondary | ICD-10-CM | POA: Diagnosis not present

## 2022-05-29 DIAGNOSIS — G629 Polyneuropathy, unspecified: Secondary | ICD-10-CM | POA: Diagnosis not present

## 2022-05-29 DIAGNOSIS — F4323 Adjustment disorder with mixed anxiety and depressed mood: Secondary | ICD-10-CM | POA: Diagnosis not present

## 2022-05-29 DIAGNOSIS — G4709 Other insomnia: Secondary | ICD-10-CM

## 2022-05-29 DIAGNOSIS — Z23 Encounter for immunization: Secondary | ICD-10-CM

## 2022-05-29 DIAGNOSIS — E785 Hyperlipidemia, unspecified: Secondary | ICD-10-CM

## 2022-05-29 DIAGNOSIS — F419 Anxiety disorder, unspecified: Secondary | ICD-10-CM

## 2022-05-29 DIAGNOSIS — F329 Major depressive disorder, single episode, unspecified: Secondary | ICD-10-CM | POA: Diagnosis not present

## 2022-05-29 MED ORDER — OMEPRAZOLE 40 MG PO CPDR: 40.0000 mg | DELAYED_RELEASE_CAPSULE | Freq: Every day | ORAL | 3 refills | Status: DC

## 2022-05-29 MED ORDER — TRAZODONE HCL 150 MG PO TABS: ORAL_TABLET | ORAL | 3 refills | Status: AC

## 2022-05-29 NOTE — Progress Notes (Unsigned)
Established patient visit  I,[name],acting as a scribe for Yvette Durie, MD.,have documented all relevant documentation on the behalf of Yvette Durie, MD,as directed by  Yvette Durie, MD while in the presence of Yvette Durie, MD.   Patient: Yvette Rogers   DOB: [birth year]   76 y.o. Female  MRN: [id number] Visit Date: 05/29/2022  Today's healthcare provider: [name], MD   Chief Complaint  Patient presents with   Follow-up   Hyperlipidemia   Anxiety   Depression   Subjective    HPI  Patient seen for the first time in the office by me for about a year and a half.  States that overall she is stable.  Is a refill on trazodone.  Other medications are reviewed and correct.  He scores a 14 on her GAD-7 but states that is stable. Couple of issues today.  1 is some right upper quadrant and epigastric discomfort that is not food related.  He is worried because her mother has a history of pancreatic cancer.  Has had no weight loss and no other symptoms.  No back pain. Complaint is 1 of right medial knee pain that is especially treated with weight weightbearing.  Been going on for some time.  Lipid/Cholesterol, Follow-up  Last lipid panel Other pertinent labs  Lab Results  Component Value Date   CHOL 233 (H) 03/26/2017   HDL 56 03/26/2017   LDLCALC 137 (H) 03/26/2017   TRIG 200 (H) 03/26/2017   Lab Results  Component Value Date   ALT 21 03/26/2017   AST 27 03/26/2017   PLT 252 03/26/2017   TSH 2.010 03/26/2017     She was last seen for this 03/26/2017.  Management since that visit includes; no  medication.  The ASCVD Risk score (Arnett DK, et al., 2019) failed to calculate for the following reasons:   Cannot find a previous HDL lab   Cannot find a previous total cholesterol lab  --------------------------------------------------------------------------------------------------- Anxiety and Depression:  She was last seen for anxiety  12/10/2020.  Changes made at last visit include; EAP rather than more benzodiazepines. Continue present meds.  Recommend avoid assistance program for counseling.Marland Kitchen  GAD-7 Results    05/29/2022    8:40 AM  GAD-7 Generalized Anxiety Disorder Screening Tool  1. Feeling Nervous, Anxious, or on Edge 3  2. Not Being Able to Stop or Control Worrying 2  3. Worrying Too Much About Different Things 2  4. Trouble Relaxing 3  5. Being So Restless it's Hard To Sit Still 2  6. Becoming Easily Annoyed or Irritable 1  7. Feeling Afraid As If Something Awful Might Happen 1  Total GAD-7 Score 14  Difficulty At Work, Home, or Getting  Along With Others? Somewhat difficult    PHQ-9 Scores    05/29/2022    8:35 AM 11/06/2021    3:44 PM 12/10/2020    4:14 PM  PHQ9 SCORE ONLY  PHQ-9 Total Score '17 1 12    '$ ---------------------------------------------------------------------------------------------------   Medications: Outpatient Medications Prior to Visit  Medication Sig   clonazePAM (KLONOPIN) 1 MG tablet Take 0.5-1 tablets (0.5-1 mg total) by mouth 2 (two) times daily as needed for anxiety.   meloxicam (MOBIC) 15 MG tablet TAKE 1 TABLET(15 MG) BY MOUTH DAILY   promethazine (PHENERGAN) 25 MG tablet promethazine 25 mg tablet   [DISCONTINUED] traZODone (DESYREL) 150 MG tablet TAKE 1 TABLET(150 MG) BY MOUTH AT BEDTIME   ALPRAZolam (  XANAX) 0.25 MG tablet alprazolam 0.25 mg tablet (Patient not taking: Reported on 11/06/2021)   ALPRAZolam (XANAX) 0.5 MG tablet alprazolam 0.5 mg tablet (Patient not taking: Reported on 11/06/2021)   benzonatate (TESSALON) 200 MG capsule Take 1 capsule (200 mg total) by mouth 3 (three) times daily as needed for cough. (Patient not taking: Reported on 11/06/2021)   buPROPion (WELLBUTRIN XL) 300 MG 24 hr tablet bupropion HCl XL 300 mg 24 hr tablet, extended release (Patient not taking: Reported on 11/06/2021)   diazepam (VALIUM) 5 MG tablet diazepam 5 mg tablet (Patient not taking:  Reported on 11/06/2021)   etodolac (LODINE) 500 MG tablet etodolac 500 mg tablet  take 1 tablet by mouth twice a day with food (Patient not taking: Reported on 11/06/2021)   fluticasone (FLONASE) 50 MCG/ACT nasal spray Place 2 sprays into both nostrils daily. (Patient not taking: Reported on 11/06/2021)   oxyCODONE-acetaminophen (PERCOCET) 10-325 MG tablet oxycodone-acetaminophen 10 mg-325 mg tablet (Patient not taking: Reported on 11/06/2021)   prednisoLONE acetate (PRED FORTE) 1 % ophthalmic suspension prednisolone acetate 1 % eye drops,suspension (Patient not taking: Reported on 11/06/2021)   sertraline (ZOLOFT) 100 MG tablet sertraline 100 mg tablet (Patient not taking: Reported on 11/06/2021)   sertraline (ZOLOFT) 50 MG tablet sertraline 50 mg tablet (Patient not taking: Reported on 11/06/2021)   Venlafaxine HCl 150 MG TB24 venlafaxine ER 150 mg tablet,extended release 24 hr (Patient not taking: Reported on 11/06/2021)   Venlafaxine HCl 225 MG TB24 venlafaxine ER 225 mg tablet,extended release 24 hr (Patient not taking: Reported on 11/06/2021)   No facility-administered medications prior to visit.    Review of Systems  Constitutional:  Negative for appetite change, chills, fatigue and fever.  Respiratory:  Negative for chest tightness and shortness of breath.   Cardiovascular:  Negative for chest pain and palpitations.  Gastrointestinal:  Negative for abdominal pain, nausea and vomiting.  Neurological:  Negative for dizziness and weakness.        Objective    BP (!) 127/56 (BP Location: Left Arm, Patient Position: Sitting, Cuff Size: Large)   Pulse 95   Resp 16   LMP  (LMP Unknown)   SpO2 98%  BP Readings from Last 3 Encounters:  05/29/22 (!) 127/56  09/24/21 (!) 148/70  04/03/21 131/76   Wt Readings from Last 3 Encounters:  09/24/21 201 lb 8 oz (91.4 kg)  12/13/20 201 lb 9.6 oz (91.4 kg)  02/01/20 184 lb 12.8 oz (83.8 kg)      Physical Exam Vitals reviewed.  Constitutional:       General: She is not in acute distress.    Appearance: She is well-developed.  HENT:     Head: Normocephalic and atraumatic.     Right Ear: Hearing normal.     Left Ear: Hearing normal.     Nose: Nose normal.  Eyes:     General: Lids are normal. No scleral icterus.       Right eye: No discharge.        Left eye: No discharge.     Conjunctiva/sclera: Conjunctivae normal.  Cardiovascular:     Rate and Rhythm: Normal rate and regular rhythm.     Heart sounds: Normal heart sounds.  Pulmonary:     Effort: Pulmonary effort is normal. No respiratory distress.  Abdominal:     Palpations: Abdomen is soft. There is no mass.     Tenderness: There is no abdominal tenderness.  Musculoskeletal:  General: No swelling.     Comments: She has medial joint line tenderness of the right knee  Skin:    Findings: No lesion or rash.  Neurological:     General: No focal deficit present.     Mental Status: She is alert and oriented to person, place, and time.  Psychiatric:        Mood and Affect: Mood normal.        Speech: Speech normal.        Behavior: Behavior normal.        Thought Content: Thought content normal.        Judgment: Judgment normal.       No results found for any visits on 05/29/22.  Assessment & Plan     1. Hyperlipidemia, unspecified hyperlipidemia type  - Lipid panel - TSH - CBC w/Diff/Platelet - Comprehensive Metabolic Panel (CMET)  2. Reactive depression States she is stable on trazodone only.  States she is off of Wellbutrin and sertraline and venlafaxine. She states she is seeing EAP at work. - Lipid panel - TSH - CBC w/Diff/Platelet - Comprehensive Metabolic Panel (CMET)  3. Anxiety Neck ongoing problem.  Advised patient to cut back on benzodiazepine use - Lipid panel - TSH - CBC w/Diff/Platelet - Comprehensive Metabolic Panel (CMET)  4. Neuropathy  - Lipid panel - TSH - CBC w/Diff/Platelet - Comprehensive Metabolic Panel (CMET)  5.  Need for influenza vaccination  - Flu Vaccine QUAD High Dose(Fluad)  6. Other insomnia  - traZODone (DESYREL) 150 MG tablet; TAKE 1 TABLET(150 MG) BY MOUTH AT BEDTIME  Dispense: 90 tablet; Refill: 3  7. Chronic pain of right knee For to orthopedics. - Ambulatory referral to Orthopedic Surgery  8. RUQ pain And lab work and right upper quadrant ultrasound.  Add omeprazole 40 mg daily - US Abdomen Complete   Return in about 1 month (around 06/28/2022).      I, Yvette Durie, MD, have reviewed all documentation for this visit. The documentation on 06/01/22 for the exam, diagnosis, procedures, and orders are all accurate and complete.    Brinklee Cisse Cranford Mon, MD  [facility] [phone] (phone) [phone] (fax)  [place]

## 2022-06-02 ENCOUNTER — Ambulatory Visit
Admission: RE | Admit: 2022-06-02 | Discharge: 2022-06-02 | Disposition: A | Discharge status: Home / Self Care | Payer: PPO | Source: Ambulatory Visit | Attending: Family Medicine | Admitting: Family Medicine

## 2022-06-02 DIAGNOSIS — K7689 Other specified diseases of liver: Secondary | ICD-10-CM | POA: Diagnosis not present

## 2022-06-02 DIAGNOSIS — R1011 Right upper quadrant pain: Secondary | ICD-10-CM | POA: Insufficient documentation

## 2022-06-03 NOTE — Addendum Note (Signed)
Addended by: Smitty Knudsen on: 06/03/2022 10:01 AM   Modules accepted: Orders

## 2022-06-07 DIAGNOSIS — M25561 Pain in right knee: Secondary | ICD-10-CM | POA: Diagnosis not present

## 2022-06-30 DIAGNOSIS — M25561 Pain in right knee: Secondary | ICD-10-CM | POA: Diagnosis not present

## 2022-07-01 NOTE — Progress Notes (Deleted)
   I,Janele Lague S Ermelinda Eckert,acting as a Education administrator for [employer], MD.,have documented all relevant documentation on the behalf of Eulis Foster, MD,as directed by  Eulis Foster, MD while in the presence of Eulis Foster, MD.     Established patient visit   Patient: Yvette Rogers   DOB: 01-26-1946   75 y.o. Female  MRN: [id number] Visit Date: 07/02/2022  Today's healthcare provider: Eulis Foster, MD   No chief complaint on file.  Subjective    HPI  ***  Medications: Outpatient Medications Prior to Visit  Medication Sig   ALPRAZolam (XANAX) 0.25 MG tablet alprazolam 0.25 mg tablet (Patient not taking: Reported on 11/06/2021)   ALPRAZolam (XANAX) 0.5 MG tablet alprazolam 0.5 mg tablet (Patient not taking: Reported on 11/06/2021)   benzonatate (TESSALON) 200 MG capsule Take 1 capsule (200 mg total) by mouth 3 (three) times daily as needed for cough. (Patient not taking: Reported on 11/06/2021)   buPROPion (WELLBUTRIN XL) 300 MG 24 hr tablet bupropion HCl XL 300 mg 24 hr tablet, extended release (Patient not taking: Reported on 11/06/2021)   clonazePAM (KLONOPIN) 1 MG tablet Take 0.5-1 tablets (0.5-1 mg total) by mouth 2 (two) times daily as needed for anxiety.   diazepam (VALIUM) 5 MG tablet diazepam 5 mg tablet (Patient not taking: Reported on 11/06/2021)   etodolac (LODINE) 500 MG tablet etodolac 500 mg tablet  take 1 tablet by mouth twice a day with food (Patient not taking: Reported on 11/06/2021)   fluticasone (FLONASE) 50 MCG/ACT nasal spray Place 2 sprays into both nostrils daily. (Patient not taking: Reported on 11/06/2021)   meloxicam (MOBIC) 15 MG tablet TAKE 1 TABLET(15 MG) BY MOUTH DAILY   omeprazole (PRILOSEC) 40 MG capsule Take 1 capsule (40 mg total) by mouth daily.   oxyCODONE-acetaminophen (PERCOCET) 10-325 MG tablet oxycodone-acetaminophen 10 mg-325 mg tablet (Patient not taking: Reported on 11/06/2021)   prednisoLONE acetate  (PRED FORTE) 1 % ophthalmic suspension prednisolone acetate 1 % eye drops,suspension (Patient not taking: Reported on 11/06/2021)   promethazine (PHENERGAN) 25 MG tablet promethazine 25 mg tablet   sertraline (ZOLOFT) 100 MG tablet sertraline 100 mg tablet (Patient not taking: Reported on 11/06/2021)   sertraline (ZOLOFT) 50 MG tablet sertraline 50 mg tablet (Patient not taking: Reported on 11/06/2021)   traZODone (DESYREL) 150 MG tablet TAKE 1 TABLET(150 MG) BY MOUTH AT BEDTIME   Venlafaxine HCl 150 MG TB24 venlafaxine ER 150 mg tablet,extended release 24 hr (Patient not taking: Reported on 11/06/2021)   Venlafaxine HCl 225 MG TB24 venlafaxine ER 225 mg tablet,extended release 24 hr (Patient not taking: Reported on 11/06/2021)   No facility-administered medications prior to visit.    Review of Systems  {Labs  Heme  Chem  Endocrine  Serology  Results Review (optional):23779}   Objective    LMP  (LMP Unknown)  BP Readings from Last 3 Encounters:  05/29/22 (!) 127/56  09/24/21 (!) 148/70  04/03/21 131/76   Wt Readings from Last 3 Encounters:  09/24/21 201 lb 8 oz (91.4 kg)  12/13/20 201 lb 9.6 oz (91.4 kg)  02/01/20 184 lb 12.8 oz (83.8 kg)      Physical Exam  ***  No results found for any visits on 07/02/22.  Assessment & Plan     ***  No follow-ups on file.      {provider attestation***:1}   Eulis Foster, MD  [facility] [phone] (phone) [phone] (fax)  [place]

## 2022-07-02 ENCOUNTER — Ambulatory Visit: Payer: PPO | Admitting: Family Medicine

## 2022-07-14 ENCOUNTER — Encounter (INDEPENDENT_AMBULATORY_CARE_PROVIDER_SITE_OTHER): Payer: Self-pay

## 2022-07-17 ENCOUNTER — Ambulatory Visit: Payer: PPO | Admitting: Family Medicine

## 2022-08-04 ENCOUNTER — Ambulatory Visit: Payer: PPO | Admitting: Family Medicine

## 2022-08-18 ENCOUNTER — Telehealth: Payer: Self-pay | Admitting: Family Medicine

## 2022-08-18 NOTE — Telephone Encounter (Signed)
Patient No show appt 10/18 Patient canceled appt. 11/02 and No show 11/20

## 2022-08-18 NOTE — Telephone Encounter (Signed)
[place] faxed refill request for the following medications:   omeprazole (PRILOSEC) 40 MG capsule    Please advise

## 2022-10-06 ENCOUNTER — Ambulatory Visit: Payer: PPO | Admitting: Gastroenterology

## 2022-12-12 DIAGNOSIS — M7121 Synovial cyst of popliteal space [Baker], right knee: Secondary | ICD-10-CM | POA: Diagnosis not present

## 2022-12-12 DIAGNOSIS — G609 Hereditary and idiopathic neuropathy, unspecified: Secondary | ICD-10-CM | POA: Diagnosis not present

## 2022-12-12 DIAGNOSIS — M1711 Unilateral primary osteoarthritis, right knee: Secondary | ICD-10-CM | POA: Diagnosis not present

## 2022-12-29 DIAGNOSIS — H26493 Other secondary cataract, bilateral: Secondary | ICD-10-CM | POA: Diagnosis not present

## 2022-12-29 DIAGNOSIS — H35033 Hypertensive retinopathy, bilateral: Secondary | ICD-10-CM | POA: Diagnosis not present

## 2022-12-29 DIAGNOSIS — H5213 Myopia, bilateral: Secondary | ICD-10-CM | POA: Diagnosis not present

## 2022-12-29 DIAGNOSIS — H353131 Nonexudative age-related macular degeneration, bilateral, early dry stage: Secondary | ICD-10-CM | POA: Diagnosis not present

## 2022-12-29 DIAGNOSIS — H1045 Other chronic allergic conjunctivitis: Secondary | ICD-10-CM | POA: Diagnosis not present

## 2023-01-06 ENCOUNTER — Other Ambulatory Visit: Payer: Self-pay | Admitting: Family Medicine

## 2023-01-06 DIAGNOSIS — Z1231 Encounter for screening mammogram for malignant neoplasm of breast: Secondary | ICD-10-CM

## 2023-01-07 DIAGNOSIS — Z136 Encounter for screening for cardiovascular disorders: Secondary | ICD-10-CM | POA: Diagnosis not present

## 2023-01-07 DIAGNOSIS — H3530 Unspecified macular degeneration: Secondary | ICD-10-CM | POA: Diagnosis not present

## 2023-01-07 DIAGNOSIS — I10 Essential (primary) hypertension: Secondary | ICD-10-CM | POA: Diagnosis not present

## 2023-01-07 DIAGNOSIS — M1710 Unilateral primary osteoarthritis, unspecified knee: Secondary | ICD-10-CM | POA: Diagnosis not present

## 2023-01-07 DIAGNOSIS — E7800 Pure hypercholesterolemia, unspecified: Secondary | ICD-10-CM | POA: Diagnosis not present

## 2023-01-07 DIAGNOSIS — F4323 Adjustment disorder with mixed anxiety and depressed mood: Secondary | ICD-10-CM | POA: Diagnosis not present

## 2023-01-07 DIAGNOSIS — Z1329 Encounter for screening for other suspected endocrine disorder: Secondary | ICD-10-CM | POA: Diagnosis not present

## 2023-01-07 DIAGNOSIS — R399 Unspecified symptoms and signs involving the genitourinary system: Secondary | ICD-10-CM | POA: Diagnosis not present

## 2023-01-07 DIAGNOSIS — Z131 Encounter for screening for diabetes mellitus: Secondary | ICD-10-CM | POA: Diagnosis not present

## 2023-01-07 DIAGNOSIS — I890 Lymphedema, not elsewhere classified: Secondary | ICD-10-CM | POA: Diagnosis not present

## 2023-01-12 ENCOUNTER — Ambulatory Visit: Payer: HMO | Admitting: Gastroenterology

## 2023-01-29 ENCOUNTER — Ambulatory Visit: Payer: HMO

## 2023-02-11 ENCOUNTER — Ambulatory Visit (INDEPENDENT_AMBULATORY_CARE_PROVIDER_SITE_OTHER): Payer: HMO

## 2023-02-11 VITALS — Ht 65.0 in | Wt 195.0 lb

## 2023-02-11 DIAGNOSIS — M9902 Segmental and somatic dysfunction of thoracic region: Secondary | ICD-10-CM | POA: Diagnosis not present

## 2023-02-11 DIAGNOSIS — M9906 Segmental and somatic dysfunction of lower extremity: Secondary | ICD-10-CM | POA: Diagnosis not present

## 2023-02-11 DIAGNOSIS — M9903 Segmental and somatic dysfunction of lumbar region: Secondary | ICD-10-CM | POA: Diagnosis not present

## 2023-02-11 DIAGNOSIS — M9901 Segmental and somatic dysfunction of cervical region: Secondary | ICD-10-CM | POA: Diagnosis not present

## 2023-02-11 DIAGNOSIS — Z0000 Encounter for general adult medical examination without abnormal findings: Secondary | ICD-10-CM | POA: Diagnosis not present

## 2023-02-11 NOTE — Patient Instructions (Signed)
Yvette Rogers , Thank you for taking time to come for your Medicare Wellness Visit. I appreciate your ongoing commitment to your health goals. Please review the following plan we discussed and let me know if I can assist you in the future.   These are the goals we discussed:  Goals      DIET - EAT MORE FRUITS AND VEGETABLES     DIET - INCREASE WATER INTAKE     Recommend to drink at least 6-8 8oz glasses of water per day.     Exercise 3x per week (30 min per time)     Recommend to start exercising 3 days a week for at least 30 minutes at a time.        This is a list of the screening recommended for you and due dates:  Health Maintenance  Topic Date Due   Zoster (Shingles) Vaccine (1 of 2) Never done   COVID-19 Vaccine (3 - 2023-24 season) 05/16/2022   DTaP/Tdap/Td vaccine (2 - Td or Tdap) 03/01/2023   Flu Shot  04/16/2023   Colon Cancer Screening  10/14/2023   Mammogram  01/14/2024   Medicare Annual Wellness Visit  02/11/2024   Pneumonia Vaccine  Completed   DEXA scan (bone density measurement)  Completed   Hepatitis C Screening  Completed   HPV Vaccine  Aged Out    Advanced directives: no  Conditions/risks identified: low falls risk  Next appointment: Follow up in one year for your annual wellness visit 02/16/2024 @ 2:30pm telephone   Preventive Care 65 Years and Older, Female Preventive care refers to lifestyle choices and visits with your health care provider that can promote health and wellness. What does preventive care include? A yearly physical exam. This is also called an annual well check. Dental exams once or twice a year. Routine eye exams. Ask your health care provider how often you should have your eyes checked. Personal lifestyle choices, including: Daily care of your teeth and gums. Regular physical activity. Eating a healthy diet. Avoiding tobacco and drug use. Limiting alcohol use. Practicing safe sex. Taking low-dose aspirin every day. Taking  vitamin and mineral supplements as recommended by your health care provider. What happens during an annual well check? The services and screenings done by your health care provider during your annual well check will depend on your age, overall health, lifestyle risk factors, and family history of disease. Counseling  Your health care provider may ask you questions about your: Alcohol use. Tobacco use. Drug use. Emotional well-being. Home and relationship well-being. Sexual activity. Eating habits. History of falls. Memory and ability to understand (cognition). Work and work Astronomer. Reproductive health. Screening  You may have the following tests or measurements: Height, weight, and BMI. Blood pressure. Lipid and cholesterol levels. These may be checked every 5 years, or more frequently if you are over 18 years old. Skin check. Lung cancer screening. You may have this screening every year starting at age 28 if you have a 30-pack-year history of smoking and currently smoke or have quit within the past 15 years. Fecal occult blood test (FOBT) of the stool. You may have this test every year starting at age 58. Flexible sigmoidoscopy or colonoscopy. You may have a sigmoidoscopy every 5 years or a colonoscopy every 10 years starting at age 93. Hepatitis C blood test. Hepatitis B blood test. Sexually transmitted disease (STD) testing. Diabetes screening. This is done by checking your blood sugar (glucose) after you have not eaten for  a while (fasting). You may have this done every 1-3 years. Bone density scan. This is done to screen for osteoporosis. You may have this done starting at age 80. Mammogram. This may be done every 1-2 years. Talk to your health care provider about how often you should have regular mammograms. Talk with your health care provider about your test results, treatment options, and if necessary, the need for more tests. Vaccines  Your health care provider may  recommend certain vaccines, such as: Influenza vaccine. This is recommended every year. Tetanus, diphtheria, and acellular pertussis (Tdap, Td) vaccine. You may need a Td booster every 10 years. Zoster vaccine. You may need this after age 67. Pneumococcal 13-valent conjugate (PCV13) vaccine. One dose is recommended after age 42. Pneumococcal polysaccharide (PPSV23) vaccine. One dose is recommended after age 32. Talk to your health care provider about which screenings and vaccines you need and how often you need them. This information is not intended to replace advice given to you by your health care provider. Make sure you discuss any questions you have with your health care provider. Document Released: 09/28/2015 Document Revised: 05/21/2016 Document Reviewed: 07/03/2015 Elsevier Interactive Patient Education  2017 [employer].  Fall Prevention in the Home Falls can cause injuries. They can happen to people of all ages. There are many things you can do to make your home safe and to help prevent falls. What can I do on the outside of my home? Regularly fix the edges of walkways and driveways and fix any cracks. Remove anything that might make you trip as you walk through a door, such as a raised step or threshold. Trim any bushes or trees on the path to your home. Use bright outdoor lighting. Clear any walking paths of anything that might make someone trip, such as rocks or tools. Regularly check to see if handrails are loose or broken. Make sure that both sides of any steps have handrails. Any raised decks and porches should have guardrails on the edges. Have any leaves, snow, or ice cleared regularly. Use sand or salt on walking paths during winter. Clean up any spills in your garage right away. This includes oil or grease spills. What can I do in the bathroom? Use night lights. Install grab bars by the toilet and in the tub and shower. Do not use towel bars as grab bars. Use non-skid  mats or decals in the tub or shower. If you need to sit down in the shower, use a plastic, non-slip stool. Keep the floor dry. Clean up any water that spills on the floor as soon as it happens. Remove soap buildup in the tub or shower regularly. Attach bath mats securely with double-sided non-slip rug tape. Do not have throw rugs and other things on the floor that can make you trip. What can I do in the bedroom? Use night lights. Make sure that you have a light by your bed that is easy to reach. Do not use any sheets or blankets that are too big for your bed. They should not hang down onto the floor. Have a firm chair that has side arms. You can use this for support while you get dressed. Do not have throw rugs and other things on the floor that can make you trip. What can I do in the kitchen? Clean up any spills right away. Avoid walking on wet floors. Keep items that you use a lot in easy-to-reach places. If you need to reach something above  you, use a strong step stool that has a grab bar. Keep electrical cords out of the way. Do not use floor polish or wax that makes floors slippery. If you must use wax, use non-skid floor wax. Do not have throw rugs and other things on the floor that can make you trip. What can I do with my stairs? Do not leave any items on the stairs. Make sure that there are handrails on both sides of the stairs and use them. Fix handrails that are broken or loose. Make sure that handrails are as long as the stairways. Check any carpeting to make sure that it is firmly attached to the stairs. Fix any carpet that is loose or worn. Avoid having throw rugs at the top or bottom of the stairs. If you do have throw rugs, attach them to the floor with carpet tape. Make sure that you have a light switch at the top of the stairs and the bottom of the stairs. If you do not have them, ask someone to add them for you. What else can I do to help prevent falls? Wear shoes  that: Do not have high heels. Have rubber bottoms. Are comfortable and fit you well. Are closed at the toe. Do not wear sandals. If you use a stepladder: Make sure that it is fully opened. Do not climb a closed stepladder. Make sure that both sides of the stepladder are locked into place. Ask someone to hold it for you, if possible. Clearly mark and make sure that you can see: Any grab bars or handrails. First and last steps. Where the edge of each step is. Use tools that help you move around (mobility aids) if they are needed. These include: Canes. Walkers. Scooters. Crutches. Turn on the lights when you go into a dark area. Replace any light bulbs as soon as they burn out. Set up your furniture so you have a clear path. Avoid moving your furniture around. If any of your floors are uneven, fix them. If there are any pets around you, be aware of where they are. Review your medicines with your doctor. Some medicines can make you feel dizzy. This can increase your chance of falling. Ask your doctor what other things that you can do to help prevent falls. This information is not intended to replace advice given to you by your health care provider. Make sure you discuss any questions you have with your health care provider. Document Released: 06/28/2009 Document Revised: 02/07/2016 Document Reviewed: 10/06/2014 Elsevier Interactive Patient Education  2017 [employer].

## 2023-02-11 NOTE — Progress Notes (Signed)
I connected with  Yvette Rogers on 02/11/23 by a audio enabled telemedicine application and verified that I am speaking with the correct person using two identifiers.  Patient Location: Home  Provider Location: Office/Clinic  I discussed the limitations of evaluation and management by telemedicine. The patient expressed understanding and agreed to proceed.  Subjective:   Yvette Rogers is a 77 y.o. female who presents for Medicare Annual (Subsequent) preventive examination.  Review of Systems    Cardiac Risk Factors include: advanced age (>46men, >51 women);obesity (BMI >30kg/m2)    Objective:    Today's Vitals   02/11/23 1514  Weight: 195 lb (88.5 kg)  Height: 5\' 5"  (1.651 m)  PainSc: 5    Body mass index is 32.45 kg/m.     02/11/2023    3:38 PM 11/06/2021    3:45 PM 10/25/2020    3:28 PM 01/31/2019    1:32 PM 10/13/2018   12:48 PM 01/14/2018    3:00 PM 01/10/2018    1:24 PM  Advanced Directives  Does Patient Have a Medical Advance Directive? No No Yes No No No No  Type of Surveyor, minerals;Living will      Copy of Healthcare Power of Attorney in Chart?   Yes - validated most recent copy scanned in chart (See row information)  No - copy requested    Would patient like information on creating a medical advance directive?  No - Patient declined  Yes (ED - Information included in AVS)  Yes (MAU/Ambulatory/Procedural Areas - Information given) No - Patient declined    Current Medications (verified) Outpatient Encounter Medications as of 02/11/2023  Medication Sig   meloxicam (MOBIC) 15 MG tablet TAKE 1 TABLET(15 MG) BY MOUTH DAILY   traZODone (DESYREL) 150 MG tablet TAKE 1 TABLET(150 MG) BY MOUTH AT BEDTIME   ALPRAZolam (XANAX) 0.25 MG tablet alprazolam 0.25 mg tablet (Patient not taking: Reported on 11/06/2021)   ALPRAZolam (XANAX) 0.5 MG tablet alprazolam 0.5 mg tablet (Patient not taking: Reported on 11/06/2021)   benzonatate (TESSALON) 200 MG  capsule Take 1 capsule (200 mg total) by mouth 3 (three) times daily as needed for cough. (Patient not taking: Reported on 11/06/2021)   buPROPion (WELLBUTRIN XL) 300 MG 24 hr tablet bupropion HCl XL 300 mg 24 hr tablet, extended release (Patient not taking: Reported on 11/06/2021)   clonazePAM (KLONOPIN) 1 MG tablet Take 0.5-1 tablets (0.5-1 mg total) by mouth 2 (two) times daily as needed for anxiety. (Patient not taking: Reported on 02/11/2023)   diazepam (VALIUM) 5 MG tablet diazepam 5 mg tablet (Patient not taking: Reported on 11/06/2021)   etodolac (LODINE) 500 MG tablet etodolac 500 mg tablet  take 1 tablet by mouth twice a day with food (Patient not taking: Reported on 11/06/2021)   fluticasone (FLONASE) 50 MCG/ACT nasal spray Place 2 sprays into both nostrils daily. (Patient not taking: Reported on 11/06/2021)   omeprazole (PRILOSEC) 40 MG capsule Take 1 capsule (40 mg total) by mouth daily. (Patient not taking: Reported on 02/11/2023)   oxyCODONE-acetaminophen (PERCOCET) 10-325 MG tablet oxycodone-acetaminophen 10 mg-325 mg tablet (Patient not taking: Reported on 11/06/2021)   prednisoLONE acetate (PRED FORTE) 1 % ophthalmic suspension prednisolone acetate 1 % eye drops,suspension (Patient not taking: Reported on 11/06/2021)   promethazine (PHENERGAN) 25 MG tablet promethazine 25 mg tablet (Patient not taking: Reported on 02/11/2023)   sertraline (ZOLOFT) 100 MG tablet sertraline 100 mg tablet (Patient not taking: Reported on 11/06/2021)  sertraline (ZOLOFT) 50 MG tablet sertraline 50 mg tablet (Patient not taking: Reported on 11/06/2021)   Venlafaxine HCl 150 MG TB24 venlafaxine ER 150 mg tablet,extended release 24 hr (Patient not taking: Reported on 11/06/2021)   Venlafaxine HCl 225 MG TB24 venlafaxine ER 225 mg tablet,extended release 24 hr (Patient not taking: Reported on 11/06/2021)   No facility-administered encounter medications on file as of 02/11/2023.    Allergies (verified) Bactrim  [sulfamethoxazole-trimethoprim], Penicillin v potassium, and Penicillins   History: Past Medical History:  Diagnosis Date   Abnormal EKG    Anxiety    Arthritis    Cancer (HCC)    skin ca   Chest pain    Depression    Shingles    Past Surgical History:  Procedure Laterality Date   BUNIONECTOMY Right    CHOLECYSTECTOMY     COLONOSCOPY     COLONOSCOPY WITH PROPOFOL N/A 10/13/2018   Procedure: COLONOSCOPY WITH PROPOFOL;  Surgeon: Scot Jun, MD;  Location: [facility] ENDOSCOPY;  Service: Endoscopy;  Laterality: N/A;   EYE SURGERY Bilateral    Cataract removed   FOOT SURGERY     GALLBLADDER SURGERY     SKIN CANCER EXCISION     face   SKIN CANCER EXCISION Left 08/22/2013   left nostril area   subconjunctival     subconjunctival hemorrhage   Family History  Problem Relation Age of Onset   Cancer Mother        pancreatic   Diabetes Mother    Kidney Stones Mother    COPD Father    Diabetes Father    Macular degeneration Father    Breast cancer Sister 73   Cancer Brother        Leukemia   Breast cancer Cousin        mat cousin   Social History   Socioeconomic History   Marital status: Widowed    Spouse name: Not on file   Number of children: 1   Years of education: Not on file   Highest education level: 12th grade  Occupational History   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  Tobacco Use   Smoking status: Never   Smokeless tobacco: Never  Vaping Use   Vaping Use: Never used  Substance and Sexual Activity   Alcohol use: Yes    Comment: rarely every 6-12 months   Drug use: No   Sexual activity: Not on file  Other Topics Concern   Not on file  Social History Narrative   Not on file   Social Determinants of Health   Financial Resource Strain: Low Risk  (02/11/2023)   Overall Financial Resource Strain (CARDIA)    Difficulty of Paying Living Expenses: Not hard at all  Food Insecurity: No Food Insecurity (02/11/2023)   Hunger  Vital Sign    Worried About Running Out of Food in the Last Year: Never true    Ran Out of Food in the Last Year: Never true  Transportation Needs: No Transportation Needs (02/11/2023)   PRAPARE - Administrator, Civil Service (Medical): No    Lack of Transportation (Non-Medical): No  Physical Activity: Insufficiently Active (02/11/2023)   Exercise Vital Sign    Days of Exercise per Week: 2 days    Minutes of Exercise per Session: 20 min  Stress: No Stress Concern Present (02/11/2023)   [employer] of Occupational Health - Occupational Stress Questionnaire    Feeling of Stress : Only  a little  Social Connections: Moderately Isolated (02/11/2023)   Social Connection and Isolation Panel [NHANES]    Frequency of Communication with Friends and Family: More than three times a week    Frequency of Social Gatherings with Friends and Family: Once a week    Attends Religious Services: More than 4 times per year    Active Member of [employer] or Organizations: No    Attends Banker Meetings: Never    Marital Status: Widowed    Tobacco Counseling Counseling given: Not Answered  Clinical Intake:  Pre-visit preparation completed: Yes  Pain : 0-10 Pain Score: 5  Pain Type: Acute pain Pain Location: Back Pain Orientation: Lower Pain Descriptors / Indicators: Aching Pain Onset: 1 to 4 weeks ago Pain Frequency: Constant Pain Relieving Factors: stretches,tylenol  Pain Relieving Factors: stretches,tylenol  BMI - recorded: 32.45 Nutritional Status: BMI > 30  Obese Nutritional Risks: None Diabetes: No  How often do you need to have someone help you when you read instructions, pamphlets, or other written materials from your doctor or pharmacy?: 1 - Never  Diabetic?no    Comments: alone alone Information entered by :: B.Abbegale Stehle,LPN   Activities of Daily Living    02/11/2023    3:39 PM 05/29/2022    8:35 AM  In your present state of health, do you have any  difficulty performing the following activities:  Hearing? 0 0  Vision? 0 0  Difficulty concentrating or making decisions? 0 0  Walking or climbing stairs? 0 1  Dressing or bathing? 0 0  Doing errands, shopping? 0 0  Preparing Food and eating ? N   Using the Toilet? N   In the past six months, have you accidently leaked urine? N   Do you have problems with loss of bowel control? N   Managing your Medications? N   Managing your Finances? N   Housekeeping or managing your Housekeeping? N     Patient Care Team: Ronnald Ramp, MD as PCP - General (Family Medicine) Isla Pence, OD (Optometry)  Indicate any recent Medical Services you may have received from other than [facility] providers in the past year (date may be approximate).     Assessment:   This is a routine wellness examination for Yvette Rogers.  Hearing/Vision screen Hearing Screening - Comments:: Adequate hearing Vision Screening - Comments:: Adequate vision Dr Clydene Pugh  Dietary issues and exercise activities discussed: Current Exercise Habits: Home exercise routine, Type of exercise: walking, Time (Minutes): 30, Frequency (Times/Week): 2, Weekly Exercise (Minutes/Week): 60, Intensity: Mild, Exercise limited by: orthopedic condition(s)   Goals Addressed             This Visit's Progress    DIET - EAT MORE FRUITS AND VEGETABLES   On track    DIET - INCREASE WATER INTAKE   On track    Recommend to drink at least 6-8 8oz glasses of water per day.     Exercise 3x per week (30 min per time)   Not on track    Recommend to start exercising 3 days a week for at least 30 minutes at a time.       Depression Screen    02/11/2023    3:20 PM 05/29/2022    8:35 AM 11/06/2021    3:44 PM 12/10/2020    4:14 PM 10/25/2020    3:23 PM 01/31/2019    1:33 PM 01/14/2018    3:01 PM  PHQ 2/9 Scores  PHQ - 2 Score 2  4 1 4 4 4 4   PHQ- 9 Score 7 17  12 15 19 14     Fall Risk    02/11/2023    3:43 PM 05/29/2022    8:35 AM 11/06/2021     3:46 PM 12/10/2020    4:13 PM 10/25/2020    3:28 PM  Fall Risk   Falls in the past year? 0 1 1 1  0  Number falls in past yr: 0 1 0 1 0  Injury with Fall? 0 1 0 1 0  Risk for fall due to : No Fall Risks History of fall(s) History of fall(s) Impaired balance/gait;Impaired mobility   Follow up Education provided;Falls prevention discussed Falls evaluation completed Falls prevention discussed Falls evaluation completed     FALL RISK PREVENTION PERTAINING TO THE HOME:  Any stairs in or around the home? No  If so, are there any without handrails? No  Home free of loose throw rugs in walkways, pet beds, electrical cords, etc? Yes  Adequate lighting in your home to reduce risk of falls? Yes   ASSISTIVE DEVICES UTILIZED TO PREVENT FALLS:  Life alert? No  Use of a cane, walker or w/c? No  Grab bars in the bathroom? No  Shower chair or bench in shower? No  Elevated toilet seat or a handicapped toilet? No    Cognitive Function:        02/11/2023    3:41 PM 09/25/2016    9:04 AM  6CIT Screen  What Year? 0 points 0 points  What month? 0 points 0 points  What time? 0 points 0 points  Count back from 20 0 points 0 points  Months in reverse 0 points 0 points  Repeat phrase 0 points 2 points  Total Score 0 points 2 points    Immunizations Immunization History  Administered Date(s) Administered   Fluad Quad(high Dose 65+) 05/29/2022   Influenza, High Dose Seasonal PF 06/15/2020   Influenza, Seasonal, Injecte, Preservative Fre 07/10/2016   Influenza,inj,Quad PF,6+ Mos 07/03/2017, 06/10/2018   Moderna Sars-Covid-2 Vaccination 10/19/2019, 11/17/2019   PPD Test 12/11/2015, 12/25/2015   Pneumococcal Conjugate-13 09/06/2015   Pneumococcal Polysaccharide-23 09/25/2016   Tdap 02/28/2013   Zoster, Live 02/28/2013    TDAP status: Up to date  Flu Vaccine status: Up to date  Pneumococcal vaccine status: Up to date  Covid-19 vaccine status: Completed vaccines  Qualifies for  Shingles Vaccine? Yes   Zostavax completed No   Shingrix Completed?: No.    Education has been provided regarding the importance of this vaccine. Patient has been advised to call insurance company to determine out of pocket expense if they have not yet received this vaccine. Advised may also receive vaccine at local pharmacy or Health Dept. Verbalized acceptance and understanding.  Screening Tests Health Maintenance  Topic Date Due   Zoster Vaccines- Shingrix (1 of 2) Never done   COVID-19 Vaccine (3 - 2023-24 season) 05/16/2022   DTaP/Tdap/Td (2 - Td or Tdap) 03/01/2023   INFLUENZA VACCINE  04/16/2023   Colonoscopy  10/14/2023   MAMMOGRAM  01/14/2024   Medicare Annual Wellness (AWV)  02/11/2024   Pneumonia Vaccine 60+ Years old  Completed   DEXA SCAN  Completed   Hepatitis C Screening  Completed   HPV VACCINES  Aged Out    Health Maintenance  Health Maintenance Due  Topic Date Due   Zoster Vaccines- Shingrix (1 of 2) Never done   COVID-19 Vaccine (3 - 2023-24 season) 05/16/2022    Colorectal  cancer screening: Type of screening: Colonoscopy. Completed yes. Repeat every 5 years  Mammogram status: Completed no. Repeat every year already ordered-due May 2024  Bone Density status: Completed yes. Results reflect: Bone density results: NORMAL. Repeat every 5 years.  Lung Cancer Screening: (Low Dose CT Chest recommended if Age 62-80 years, 30 pack-year currently smoking OR have quit w/in 15years.) does not qualify.   Lung Cancer Screening Referral: no  Additional Screening:  Hepatitis C Screening: does not qualify; Completed yes  Vision Screening: Recommended annual ophthalmology exams for early detection of glaucoma and other disorders of the eye. Is the patient up to date with their annual eye exam?  Yes  Who is the provider or what is the name of the office in which the patient attends annual eye exams? Dr Clydene Pugh If pt is not established with a provider, would they like to  be referred to a provider to establish care? No .   Dental Screening: Recommended annual dental exams for proper oral hygiene  Community Resource Referral / Chronic Care Management: CRR required this visit?  No   CCM required this visit?  No    Plan:     I have personally reviewed and noted the following in the patient's chart:   Medical and social history Use of alcohol, tobacco or illicit drugs  Current medications and supplements including opioid prescriptions. Patient is not currently taking opioid prescriptions. Functional ability and status Nutritional status Physical activity Advanced directives List of other physicians Hospitalizations, surgeries, and ER visits in previous 12 months Vitals Screenings to include cognitive, depression, and falls Referrals and appointments  In addition, I have reviewed and discussed with patient certain preventive protocols, quality metrics, and best practice recommendations. A written personalized care plan for preventive services as well as general preventive health recommendations were provided to patient.   Sue Lush, LPN   1/61/0960   Nurse Notes: The patient states she is doing well and has no concerns or questions at this time.

## 2023-02-16 DIAGNOSIS — M9903 Segmental and somatic dysfunction of lumbar region: Secondary | ICD-10-CM | POA: Diagnosis not present

## 2023-02-16 DIAGNOSIS — M9901 Segmental and somatic dysfunction of cervical region: Secondary | ICD-10-CM | POA: Diagnosis not present

## 2023-02-16 DIAGNOSIS — M9906 Segmental and somatic dysfunction of lower extremity: Secondary | ICD-10-CM | POA: Diagnosis not present

## 2023-02-16 DIAGNOSIS — M9902 Segmental and somatic dysfunction of thoracic region: Secondary | ICD-10-CM | POA: Diagnosis not present

## 2023-02-17 DIAGNOSIS — M9906 Segmental and somatic dysfunction of lower extremity: Secondary | ICD-10-CM | POA: Diagnosis not present

## 2023-02-17 DIAGNOSIS — M9903 Segmental and somatic dysfunction of lumbar region: Secondary | ICD-10-CM | POA: Diagnosis not present

## 2023-02-17 DIAGNOSIS — M9901 Segmental and somatic dysfunction of cervical region: Secondary | ICD-10-CM | POA: Diagnosis not present

## 2023-02-17 DIAGNOSIS — M9902 Segmental and somatic dysfunction of thoracic region: Secondary | ICD-10-CM | POA: Diagnosis not present

## 2023-02-19 DIAGNOSIS — M9902 Segmental and somatic dysfunction of thoracic region: Secondary | ICD-10-CM | POA: Diagnosis not present

## 2023-02-19 DIAGNOSIS — M9906 Segmental and somatic dysfunction of lower extremity: Secondary | ICD-10-CM | POA: Diagnosis not present

## 2023-02-19 DIAGNOSIS — M9901 Segmental and somatic dysfunction of cervical region: Secondary | ICD-10-CM | POA: Diagnosis not present

## 2023-02-19 DIAGNOSIS — M9903 Segmental and somatic dysfunction of lumbar region: Secondary | ICD-10-CM | POA: Diagnosis not present

## 2023-02-23 DIAGNOSIS — M9903 Segmental and somatic dysfunction of lumbar region: Secondary | ICD-10-CM | POA: Diagnosis not present

## 2023-02-23 DIAGNOSIS — M9906 Segmental and somatic dysfunction of lower extremity: Secondary | ICD-10-CM | POA: Diagnosis not present

## 2023-02-23 DIAGNOSIS — M9902 Segmental and somatic dysfunction of thoracic region: Secondary | ICD-10-CM | POA: Diagnosis not present

## 2023-02-23 DIAGNOSIS — M9901 Segmental and somatic dysfunction of cervical region: Secondary | ICD-10-CM | POA: Diagnosis not present

## 2023-02-24 DIAGNOSIS — M9906 Segmental and somatic dysfunction of lower extremity: Secondary | ICD-10-CM | POA: Diagnosis not present

## 2023-02-24 DIAGNOSIS — M9901 Segmental and somatic dysfunction of cervical region: Secondary | ICD-10-CM | POA: Diagnosis not present

## 2023-02-24 DIAGNOSIS — M9902 Segmental and somatic dysfunction of thoracic region: Secondary | ICD-10-CM | POA: Diagnosis not present

## 2023-02-24 DIAGNOSIS — M9903 Segmental and somatic dysfunction of lumbar region: Secondary | ICD-10-CM | POA: Diagnosis not present

## 2023-02-26 DIAGNOSIS — M9906 Segmental and somatic dysfunction of lower extremity: Secondary | ICD-10-CM | POA: Diagnosis not present

## 2023-02-26 DIAGNOSIS — M9901 Segmental and somatic dysfunction of cervical region: Secondary | ICD-10-CM | POA: Diagnosis not present

## 2023-02-26 DIAGNOSIS — M9903 Segmental and somatic dysfunction of lumbar region: Secondary | ICD-10-CM | POA: Diagnosis not present

## 2023-02-26 DIAGNOSIS — M9902 Segmental and somatic dysfunction of thoracic region: Secondary | ICD-10-CM | POA: Diagnosis not present

## 2023-03-02 DIAGNOSIS — M9902 Segmental and somatic dysfunction of thoracic region: Secondary | ICD-10-CM | POA: Diagnosis not present

## 2023-03-02 DIAGNOSIS — M9901 Segmental and somatic dysfunction of cervical region: Secondary | ICD-10-CM | POA: Diagnosis not present

## 2023-03-02 DIAGNOSIS — M9903 Segmental and somatic dysfunction of lumbar region: Secondary | ICD-10-CM | POA: Diagnosis not present

## 2023-03-02 DIAGNOSIS — M9906 Segmental and somatic dysfunction of lower extremity: Secondary | ICD-10-CM | POA: Diagnosis not present

## 2023-03-03 DIAGNOSIS — M9902 Segmental and somatic dysfunction of thoracic region: Secondary | ICD-10-CM | POA: Diagnosis not present

## 2023-03-03 DIAGNOSIS — M9903 Segmental and somatic dysfunction of lumbar region: Secondary | ICD-10-CM | POA: Diagnosis not present

## 2023-03-03 DIAGNOSIS — M9906 Segmental and somatic dysfunction of lower extremity: Secondary | ICD-10-CM | POA: Diagnosis not present

## 2023-03-03 DIAGNOSIS — M9901 Segmental and somatic dysfunction of cervical region: Secondary | ICD-10-CM | POA: Diagnosis not present

## 2023-03-05 DIAGNOSIS — M9901 Segmental and somatic dysfunction of cervical region: Secondary | ICD-10-CM | POA: Diagnosis not present

## 2023-03-05 DIAGNOSIS — M9903 Segmental and somatic dysfunction of lumbar region: Secondary | ICD-10-CM | POA: Diagnosis not present

## 2023-03-05 DIAGNOSIS — M9902 Segmental and somatic dysfunction of thoracic region: Secondary | ICD-10-CM | POA: Diagnosis not present

## 2023-03-05 DIAGNOSIS — M9906 Segmental and somatic dysfunction of lower extremity: Secondary | ICD-10-CM | POA: Diagnosis not present

## 2023-03-09 DIAGNOSIS — M9902 Segmental and somatic dysfunction of thoracic region: Secondary | ICD-10-CM | POA: Diagnosis not present

## 2023-03-09 DIAGNOSIS — M9906 Segmental and somatic dysfunction of lower extremity: Secondary | ICD-10-CM | POA: Diagnosis not present

## 2023-03-09 DIAGNOSIS — M9901 Segmental and somatic dysfunction of cervical region: Secondary | ICD-10-CM | POA: Diagnosis not present

## 2023-03-09 DIAGNOSIS — M9903 Segmental and somatic dysfunction of lumbar region: Secondary | ICD-10-CM | POA: Diagnosis not present

## 2023-03-11 DIAGNOSIS — M9903 Segmental and somatic dysfunction of lumbar region: Secondary | ICD-10-CM | POA: Diagnosis not present

## 2023-03-11 DIAGNOSIS — M9906 Segmental and somatic dysfunction of lower extremity: Secondary | ICD-10-CM | POA: Diagnosis not present

## 2023-03-11 DIAGNOSIS — M9901 Segmental and somatic dysfunction of cervical region: Secondary | ICD-10-CM | POA: Diagnosis not present

## 2023-03-11 DIAGNOSIS — M9902 Segmental and somatic dysfunction of thoracic region: Secondary | ICD-10-CM | POA: Diagnosis not present

## 2023-03-16 DIAGNOSIS — M9901 Segmental and somatic dysfunction of cervical region: Secondary | ICD-10-CM | POA: Diagnosis not present

## 2023-03-16 DIAGNOSIS — G43909 Migraine, unspecified, not intractable, without status migrainosus: Secondary | ICD-10-CM | POA: Diagnosis not present

## 2023-03-16 DIAGNOSIS — M9906 Segmental and somatic dysfunction of lower extremity: Secondary | ICD-10-CM | POA: Diagnosis not present

## 2023-03-16 DIAGNOSIS — R202 Paresthesia of skin: Secondary | ICD-10-CM | POA: Diagnosis not present

## 2023-03-16 DIAGNOSIS — M9902 Segmental and somatic dysfunction of thoracic region: Secondary | ICD-10-CM | POA: Diagnosis not present

## 2023-03-16 DIAGNOSIS — R252 Cramp and spasm: Secondary | ICD-10-CM | POA: Diagnosis not present

## 2023-03-16 DIAGNOSIS — M9903 Segmental and somatic dysfunction of lumbar region: Secondary | ICD-10-CM | POA: Diagnosis not present

## 2023-03-16 DIAGNOSIS — R200 Anesthesia of skin: Secondary | ICD-10-CM | POA: Diagnosis not present

## 2023-03-23 DIAGNOSIS — M9903 Segmental and somatic dysfunction of lumbar region: Secondary | ICD-10-CM | POA: Diagnosis not present

## 2023-03-23 DIAGNOSIS — M9906 Segmental and somatic dysfunction of lower extremity: Secondary | ICD-10-CM | POA: Diagnosis not present

## 2023-03-23 DIAGNOSIS — M9902 Segmental and somatic dysfunction of thoracic region: Secondary | ICD-10-CM | POA: Diagnosis not present

## 2023-03-23 DIAGNOSIS — M9901 Segmental and somatic dysfunction of cervical region: Secondary | ICD-10-CM | POA: Diagnosis not present

## 2023-03-24 DIAGNOSIS — R200 Anesthesia of skin: Secondary | ICD-10-CM | POA: Diagnosis not present

## 2023-03-24 DIAGNOSIS — F4323 Adjustment disorder with mixed anxiety and depressed mood: Secondary | ICD-10-CM | POA: Diagnosis not present

## 2023-03-24 DIAGNOSIS — H3530 Unspecified macular degeneration: Secondary | ICD-10-CM | POA: Diagnosis not present

## 2023-03-24 DIAGNOSIS — M1710 Unilateral primary osteoarthritis, unspecified knee: Secondary | ICD-10-CM | POA: Diagnosis not present

## 2023-03-24 DIAGNOSIS — R202 Paresthesia of skin: Secondary | ICD-10-CM | POA: Diagnosis not present

## 2023-03-24 DIAGNOSIS — E7800 Pure hypercholesterolemia, unspecified: Secondary | ICD-10-CM | POA: Diagnosis not present

## 2023-03-24 DIAGNOSIS — Z0000 Encounter for general adult medical examination without abnormal findings: Secondary | ICD-10-CM | POA: Diagnosis not present

## 2023-03-25 DIAGNOSIS — M9901 Segmental and somatic dysfunction of cervical region: Secondary | ICD-10-CM | POA: Diagnosis not present

## 2023-03-25 DIAGNOSIS — M9906 Segmental and somatic dysfunction of lower extremity: Secondary | ICD-10-CM | POA: Diagnosis not present

## 2023-03-25 DIAGNOSIS — M9902 Segmental and somatic dysfunction of thoracic region: Secondary | ICD-10-CM | POA: Diagnosis not present

## 2023-03-25 DIAGNOSIS — M9903 Segmental and somatic dysfunction of lumbar region: Secondary | ICD-10-CM | POA: Diagnosis not present

## 2023-03-30 DIAGNOSIS — M9902 Segmental and somatic dysfunction of thoracic region: Secondary | ICD-10-CM | POA: Diagnosis not present

## 2023-03-30 DIAGNOSIS — M9903 Segmental and somatic dysfunction of lumbar region: Secondary | ICD-10-CM | POA: Diagnosis not present

## 2023-03-30 DIAGNOSIS — M9906 Segmental and somatic dysfunction of lower extremity: Secondary | ICD-10-CM | POA: Diagnosis not present

## 2023-03-30 DIAGNOSIS — M9901 Segmental and somatic dysfunction of cervical region: Secondary | ICD-10-CM | POA: Diagnosis not present

## 2023-03-31 DIAGNOSIS — M9901 Segmental and somatic dysfunction of cervical region: Secondary | ICD-10-CM | POA: Diagnosis not present

## 2023-03-31 DIAGNOSIS — M9903 Segmental and somatic dysfunction of lumbar region: Secondary | ICD-10-CM | POA: Diagnosis not present

## 2023-03-31 DIAGNOSIS — M9902 Segmental and somatic dysfunction of thoracic region: Secondary | ICD-10-CM | POA: Diagnosis not present

## 2023-03-31 DIAGNOSIS — M9906 Segmental and somatic dysfunction of lower extremity: Secondary | ICD-10-CM | POA: Diagnosis not present

## 2023-04-07 DIAGNOSIS — M9902 Segmental and somatic dysfunction of thoracic region: Secondary | ICD-10-CM | POA: Diagnosis not present

## 2023-04-07 DIAGNOSIS — M9901 Segmental and somatic dysfunction of cervical region: Secondary | ICD-10-CM | POA: Diagnosis not present

## 2023-04-07 DIAGNOSIS — M9906 Segmental and somatic dysfunction of lower extremity: Secondary | ICD-10-CM | POA: Diagnosis not present

## 2023-04-07 DIAGNOSIS — M9903 Segmental and somatic dysfunction of lumbar region: Secondary | ICD-10-CM | POA: Diagnosis not present

## 2023-05-04 ENCOUNTER — Ambulatory Visit: Payer: HMO | Admitting: Gastroenterology

## 2023-05-04 ENCOUNTER — Encounter: Payer: Self-pay | Admitting: Gastroenterology

## 2023-05-04 VITALS — BP 138/88 | HR 92 | Temp 98.1°F | Ht 65.0 in | Wt 195.0 lb

## 2023-05-04 DIAGNOSIS — K5900 Constipation, unspecified: Secondary | ICD-10-CM

## 2023-05-04 DIAGNOSIS — R109 Unspecified abdominal pain: Secondary | ICD-10-CM

## 2023-05-04 NOTE — Progress Notes (Signed)
Gastroenterology Consultation  Referring Provider:     Bosie Clos, MD Primary Care Physician:  Ronnald Ramp, MD Primary Gastroenterologist:  Dr. Servando Snare     Reason for Consultation:     Right upper quadrant pain        HPI:   Yvette Rogers is a 77 y.o. y/o female referred for consultation & management of right upper quadrant pain by Dr. Neita Garnet, Tawanna Cooler, MD. This patient comes in today with a report of right upper quadrant pain.  The pain appears to have been present for some time with a right upper quadrant ultrasound back in September 2023 for the same reason.  At that time the ultrasound showed:  IMPRESSION: 1. Diffuse increased echogenicity throughout the liver is nonspecific but often due to hepatic steatosis. 2. The patient is status post cholecystectomy. 3. No other abnormalities are identified. Only a portion of the pancreas was visualized on today's imaging. Specifically, the distal body and tail were not visualized.  In 2020 the patient had a colonoscopy by Dr. Mechele Collin which showed a adenoma. The patient denies any GI function to make the pain any better or worse.  She reports that the pain is sometimes worse when she lays down at night.  She also reports that she is constipated but takes MiraLAX in large doses when she gets constipated and will have a bowel movement.  She states that this happens approximately once a week. There is no report of any unexplained weight loss, black stools or bloody stools.  The patient also denies any dysphagia nausea or vomiting.  Past Medical History:  Diagnosis Date   Abnormal EKG    Anxiety    Arthritis    Cancer (HCC)    skin ca   Chest pain    Depression    Shingles     Past Surgical History:  Procedure Laterality Date   BUNIONECTOMY Right    CHOLECYSTECTOMY     COLONOSCOPY     COLONOSCOPY WITH PROPOFOL N/A 10/13/2018   Procedure: COLONOSCOPY WITH PROPOFOL;  Surgeon: Scot Jun, MD;   Location: [facility] ENDOSCOPY;  Service: Endoscopy;  Laterality: N/A;   EYE SURGERY Bilateral    Cataract removed   FOOT SURGERY     GALLBLADDER SURGERY     SKIN CANCER EXCISION     face   SKIN CANCER EXCISION Left 08/22/2013   left nostril area   subconjunctival     subconjunctival hemorrhage    Prior to Admission medications   Medication Sig Start Date End Date Taking? Authorizing Provider  ALPRAZolam Prudy Feeler) 0.25 MG tablet alprazolam 0.25 mg tablet Patient not taking: Reported on 11/06/2021    [provider]  ALPRAZolam Prudy Feeler) 0.5 MG tablet alprazolam 0.5 mg tablet Patient not taking: Reported on 11/06/2021    [provider]  benzonatate (TESSALON) 200 MG capsule Take 1 capsule (200 mg total) by mouth 3 (three) times daily as needed for cough. Patient not taking: Reported on 11/06/2021 09/24/21   Isa Rankin, MD  buPROPion (WELLBUTRIN XL) 300 MG 24 hr tablet bupropion HCl XL 300 mg 24 hr tablet, extended release Patient not taking: Reported on 11/06/2021    [provider]  clonazePAM (KLONOPIN) 1 MG tablet Take 0.5-1 tablets (0.5-1 mg total) by mouth 2 (two) times daily as needed for anxiety. Patient not taking: Reported on 02/11/2023 12/23/20   Bosie Clos, MD  diazepam (VALIUM) 5 MG tablet diazepam 5 mg tablet Patient not taking: Reported  on 11/06/2021    [provider]  etodolac (LODINE) 500 MG tablet etodolac 500 mg tablet  take 1 tablet by mouth twice a day with food Patient not taking: Reported on 11/06/2021    [provider]  fluticasone (FLONASE) 50 MCG/ACT nasal spray Place 2 sprays into both nostrils daily. Patient not taking: Reported on 11/06/2021 09/24/21   Isa Rankin, MD  meloxicam (MOBIC) 15 MG tablet TAKE 1 TABLET(15 MG) BY MOUTH DAILY 07/02/21   Bosie Clos, MD  omeprazole (PRILOSEC) 40 MG capsule Take 1 capsule (40 mg total) by mouth daily. Patient not taking: Reported on 02/11/2023 05/29/22    Bosie Clos, MD  oxyCODONE-acetaminophen (PERCOCET) 10-325 MG tablet oxycodone-acetaminophen 10 mg-325 mg tablet Patient not taking: Reported on 11/06/2021    [provider]  prednisoLONE acetate (PRED FORTE) 1 % ophthalmic suspension prednisolone acetate 1 % eye drops,suspension Patient not taking: Reported on 11/06/2021    [provider]  promethazine (PHENERGAN) 25 MG tablet promethazine 25 mg tablet Patient not taking: Reported on 02/11/2023    [provider]  sertraline (ZOLOFT) 100 MG tablet sertraline 100 mg tablet Patient not taking: Reported on 11/06/2021    [provider]  sertraline (ZOLOFT) 50 MG tablet sertraline 50 mg tablet Patient not taking: Reported on 11/06/2021    [provider]  traZODone (DESYREL) 150 MG tablet TAKE 1 TABLET(150 MG) BY MOUTH AT BEDTIME 05/29/22   Bosie Clos, MD  Venlafaxine HCl 150 MG TB24 venlafaxine ER 150 mg tablet,extended release 24 hr Patient not taking: Reported on 11/06/2021    [provider]  Venlafaxine HCl 225 MG TB24 venlafaxine ER 225 mg tablet,extended release 24 hr Patient not taking: Reported on 11/06/2021    [provider]    Family History  Problem Relation Age of Onset   Cancer Mother        pancreatic   Diabetes Mother    Kidney Stones Mother    COPD Father    Diabetes Father    Macular degeneration Father    Breast cancer Sister 79   Cancer Brother        Leukemia   Breast cancer Cousin        mat cousin     Social History   Tobacco Use   Smoking status: Never   Smokeless tobacco: Never  Vaping Use   Vaping status: Never Used  Substance Use Topics   Alcohol use: Yes    Comment: rarely every 6-12 months   Drug use: No    Allergies as of 05/04/2023 - Review Complete 05/04/2023  Allergen Reaction Noted   Bactrim [sulfamethoxazole-trimethoprim] Swelling 07/14/2017   Penicillin v potassium Hives 01/24/2015   Penicillins Hives 10/27/2013     Review of Systems:    All systems reviewed and negative except where noted in HPI.   Physical Exam:  BP 138/88 (BP Location: Left Arm, Patient Position: Sitting, Cuff Size: Large)   Pulse 92   Temp 98.1 F (36.7 C) (Oral)   Ht 5\' 5"  (1.651 m)   Wt 195 lb (88.5 kg)   LMP  (LMP Unknown)   BMI 32.45 kg/m  No LMP recorded (lmp unknown). Patient is postmenopausal. General:   Alert,  Well-developed, well-nourished, pleasant and cooperative in NAD Head:  Normocephalic and atraumatic. Eyes:  Sclera clear, no icterus.   Conjunctiva pink. Ears:  Normal auditory acuity. Neck:  Supple; no masses or thyromegaly. Lungs:  Respirations even and unlabored.  Clear throughout to auscultation.   No wheezes, crackles, or rhonchi. No acute distress. Heart:  Regular rate and rhythm; no murmurs, clicks, rubs, or gallops. Abdomen:  Normal bowel sounds.  No bruits.  Soft, positive tenderness to 1 finger palpation while flexing the abdominal wall muscles and non-distended without masses, hepatosplenomegaly or hernias noted.  No guarding or rebound tenderness.  Positive Carnett sign.   Rectal:  Deferred.  Pulses:  Normal pulses noted. Extremities:  No clubbing or edema.  No cyanosis. Neurologic:  Alert and oriented x3;  grossly normal neurologically. Skin:  Intact without significant lesions or rashes.  No jaundice. Lymph Nodes:  No significant cervical adenopathy. Psych:  Alert and cooperative. Normal mood and affect.  Imaging Studies: No results found.  Assessment and Plan:   Yvette Rogers is a 77 y.o. y/o female who comes in with reproducible pain to 1 finger palpation while flexing the abdominal wall muscles consistent with musculoskeletal pain.  The patient's pain is likely caused by her chronic constipation.  The patient states that she moves her bowels once a week.  She has been encouraged to take MiraLAX daily to keep her going in a more regular fashion and to titrate the dose to effect.  The  patient has been explained my findings and agrees with it.  The patient will contact me if she has any further problems.    Midge Minium, MD. Clementeen Graham    Note: This dictation was prepared with Dragon dictation along with smaller phrase technology. Any transcriptional errors that result from this process are unintentional.

## 2023-06-11 DIAGNOSIS — E7800 Pure hypercholesterolemia, unspecified: Secondary | ICD-10-CM | POA: Diagnosis not present

## 2023-06-11 DIAGNOSIS — M1710 Unilateral primary osteoarthritis, unspecified knee: Secondary | ICD-10-CM | POA: Diagnosis not present

## 2023-06-11 DIAGNOSIS — G8929 Other chronic pain: Secondary | ICD-10-CM | POA: Diagnosis not present

## 2023-06-11 DIAGNOSIS — M5442 Lumbago with sciatica, left side: Secondary | ICD-10-CM | POA: Diagnosis not present

## 2023-06-11 DIAGNOSIS — F4323 Adjustment disorder with mixed anxiety and depressed mood: Secondary | ICD-10-CM | POA: Diagnosis not present

## 2023-06-11 DIAGNOSIS — I890 Lymphedema, not elsewhere classified: Secondary | ICD-10-CM | POA: Diagnosis not present

## 2023-06-25 DIAGNOSIS — R252 Cramp and spasm: Secondary | ICD-10-CM | POA: Diagnosis not present

## 2023-06-25 DIAGNOSIS — R200 Anesthesia of skin: Secondary | ICD-10-CM | POA: Diagnosis not present

## 2023-06-25 DIAGNOSIS — R202 Paresthesia of skin: Secondary | ICD-10-CM | POA: Diagnosis not present

## 2023-06-25 DIAGNOSIS — G43909 Migraine, unspecified, not intractable, without status migrainosus: Secondary | ICD-10-CM | POA: Diagnosis not present

## 2023-06-26 ENCOUNTER — Other Ambulatory Visit: Payer: Self-pay | Admitting: Physician Assistant

## 2023-06-26 DIAGNOSIS — M5416 Radiculopathy, lumbar region: Secondary | ICD-10-CM

## 2023-06-28 ENCOUNTER — Ambulatory Visit
Admission: RE | Admit: 2023-06-28 | Discharge: 2023-06-28 | Disposition: A | Discharge status: Home / Self Care | Payer: HMO | Source: Ambulatory Visit | Attending: Physician Assistant | Admitting: Physician Assistant

## 2023-06-28 DIAGNOSIS — M4316 Spondylolisthesis, lumbar region: Secondary | ICD-10-CM | POA: Diagnosis not present

## 2023-06-28 DIAGNOSIS — M5416 Radiculopathy, lumbar region: Secondary | ICD-10-CM | POA: Diagnosis not present

## 2023-06-28 DIAGNOSIS — M5137 Other intervertebral disc degeneration, lumbosacral region with discogenic back pain only: Secondary | ICD-10-CM | POA: Diagnosis not present

## 2023-06-28 DIAGNOSIS — M5136 Other intervertebral disc degeneration, lumbar region with discogenic back pain only: Secondary | ICD-10-CM | POA: Diagnosis not present

## 2023-06-28 DIAGNOSIS — M47816 Spondylosis without myelopathy or radiculopathy, lumbar region: Secondary | ICD-10-CM | POA: Diagnosis not present

## 2023-07-30 DIAGNOSIS — H3530 Unspecified macular degeneration: Secondary | ICD-10-CM | POA: Diagnosis not present

## 2023-07-30 DIAGNOSIS — F4323 Adjustment disorder with mixed anxiety and depressed mood: Secondary | ICD-10-CM | POA: Diagnosis not present

## 2023-07-30 DIAGNOSIS — G8929 Other chronic pain: Secondary | ICD-10-CM | POA: Diagnosis not present

## 2023-07-30 DIAGNOSIS — Z23 Encounter for immunization: Secondary | ICD-10-CM | POA: Diagnosis not present

## 2023-07-30 DIAGNOSIS — M5442 Lumbago with sciatica, left side: Secondary | ICD-10-CM | POA: Diagnosis not present

## 2023-08-18 ENCOUNTER — Ambulatory Visit
Admission: EM | Admit: 2023-08-18 | Discharge: 2023-08-18 | Disposition: A | Discharge status: Home / Self Care | Payer: HMO | Attending: Physician Assistant | Admitting: Physician Assistant

## 2023-08-18 DIAGNOSIS — N3000 Acute cystitis without hematuria: Secondary | ICD-10-CM | POA: Insufficient documentation

## 2023-08-18 LAB — URINALYSIS, W/ REFLEX TO CULTURE (INFECTION SUSPECTED)
Bilirubin Urine: NEGATIVE
Glucose, UA: NEGATIVE mg/dL
Ketones, ur: NEGATIVE mg/dL
Nitrite: NEGATIVE
Protein, ur: 30 mg/dL — AB
Specific Gravity, Urine: 1.02 (ref 1.005–1.030)
WBC, UA: >50 WBC/hpf (ref 0–5)
pH: 8.5 — ABNORMAL HIGH (ref 5.0–8.0)

## 2023-08-18 MED ORDER — CIPROFLOXACIN HCL 500 MG PO TABS: 500.0000 mg | ORAL_TABLET | Freq: Two times a day (BID) | ORAL | 0 refills | Status: AC

## 2023-08-18 NOTE — ED Provider Notes (Addendum)
[facility]    CSN: [id number] Arrival date & time: 08/18/23  1712      History   Chief Complaint Chief Complaint  Patient presents with   Urinary Frequency    HPI Yvette Rogers is a 77 y.o. female presenting for approximately 1 week history of dysuria, frequency and urgency as well as lower abdominal cramping and lower back pain.  Denies fever, hematuria, vaginal discharge.  No concern for STIs.  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   SCH (subconjunctival hemorrhage) 03/23/2015   Tenosynovitis 03/23/2015   Variants of migraine 09/13/2009   Adjustment disorder with mixed anxiety and depressed mood 06/11/2009    Past Surgical History:  Procedure Laterality Date   BUNIONECTOMY Right    CHOLECYSTECTOMY     COLONOSCOPY     COLONOSCOPY WITH PROPOFOL N/A 10/13/2018   Procedure: COLONOSCOPY WITH PROPOFOL;  Surgeon: Scot Jun, MD;  Location: [facility] ENDOSCOPY;  Service: Endoscopy;  Laterality: N/A;   EYE SURGERY Bilateral    Cataract removed   FOOT SURGERY     GALLBLADDER SURGERY     SKIN CANCER EXCISION     face   SKIN CANCER EXCISION Left 08/22/2013   left nostril area   subconjunctival     subconjunctival hemorrhage    OB History   No obstetric history on file.      Home Medications    Prior to Admission medications   Medication Sig Start Date End Date Taking? Authorizing Provider  ciprofloxacin (CIPRO) 500 MG tablet Take 1 tablet (500 mg total) by mouth every 12 (twelve)  hours for 5 days. 08/18/23 08/23/23 Yes Eusebio Friendly B, PA-C  gabapentin (NEURONTIN) 100 MG capsule Take 100 mg by mouth 2 (two) times daily.   Yes [provider]  acetaminophen (TYLENOL) 500 MG tablet Take 500 mg by mouth every 6 (six) hours as needed.    [provider]  magnesium 30 MG tablet Take 30 mg by mouth 2 (two) times daily.    [provider]  meloxicam (MOBIC) 15 MG tablet TAKE 1 TABLET(15 MG) BY MOUTH DAILY 07/02/21   Bosie Clos, MD  traZODone (DESYREL) 150 MG tablet TAKE 1 TABLET(150 MG) BY MOUTH AT BEDTIME 05/29/22   Bosie Clos, MD    Family History Family History  Problem Relation Age of Onset   Cancer Mother        pancreatic   Diabetes Mother    Kidney Stones Mother    COPD Father    Diabetes Father    Macular degeneration Father    Breast cancer Sister 33   Cancer Brother        Leukemia   Breast cancer Cousin        mat cousin    Social History Social History   Tobacco Use   Smoking status: Never   Smokeless tobacco: Never  Vaping Use   Vaping  status: Never Used  Substance Use Topics   Alcohol use: Yes    Comment: rarely every 6-12 months   Drug use: No     Allergies   Bactrim [sulfamethoxazole-trimethoprim], Penicillin v potassium, and Penicillins   Review of Systems Review of Systems  Constitutional:  Negative for chills, fatigue and fever.  Gastrointestinal:  Positive for abdominal pain. Negative for diarrhea, nausea and vomiting.  Genitourinary:  Positive for dysuria, frequency and urgency. Negative for decreased urine volume, flank pain, hematuria, pelvic pain, vaginal bleeding, vaginal discharge and vaginal pain.  Musculoskeletal:  Positive for back pain.  Skin:  Negative for rash.     Physical Exam Triage Vital Signs ED Triage Vitals  Encounter Vitals Group     BP      Systolic BP Percentile      Diastolic BP Percentile      Pulse      Resp      Temp      Temp src      SpO2       Weight      Height      Head Circumference      Peak Flow      Pain Score      Pain Loc      Pain Education      Exclude from Growth Chart    No data found.  Updated Vital Signs BP (!) 160/94 (BP Location: Left Arm)   Pulse 73   Temp 98.6 F (37 C) (Oral)   Resp 18   LMP  (LMP Unknown)   SpO2 98%    Physical Exam Vitals and nursing note reviewed.  Constitutional:      General: She is not in acute distress.    Appearance: Normal appearance. She is not ill-appearing or toxic-appearing.  HENT:     Head: Normocephalic and atraumatic.  Eyes:     General: No scleral icterus.       Right eye: No discharge.        Left eye: No discharge.     Conjunctiva/sclera: Conjunctivae normal.  Cardiovascular:     Rate and Rhythm: Normal rate and regular rhythm.     Heart sounds: Normal heart sounds.  Pulmonary:     Effort: Pulmonary effort is normal. No respiratory distress.     Breath sounds: Normal breath sounds.  Abdominal:     Palpations: Abdomen is soft.     Tenderness: There is abdominal tenderness (suprapubic). There is no right CVA tenderness or left CVA tenderness.  Musculoskeletal:     Cervical back: Neck supple.  Skin:    General: Skin is dry.  Neurological:     General: No focal deficit present.     Mental Status: She is alert. Mental status is at baseline.     Motor: No weakness.     Gait: Gait normal.  Psychiatric:        Mood and Affect: Mood normal.        Behavior: Behavior normal.        Thought Content: Thought content normal.      UC Treatments / Results  Labs (all labs ordered are listed, but only abnormal results are displayed) Labs Reviewed  URINALYSIS, W/ REFLEX TO CULTURE (INFECTION SUSPECTED) - Abnormal; Notable for the following components:      Result Value   APPearance CLOUDY (*)    pH 8.5 (*)    Hgb urine dipstick SMALL (*)    Protein, ur  30 (*)    Leukocytes,Ua MODERATE (*)    Bacteria, UA MANY (*)    All other components within  normal limits  URINE CULTURE    EKG   Radiology No results found.  Procedures Procedures (including critical care time)  Medications Ordered in UC Medications - No data to display  Initial Impression / Assessment and Plan / UC Course  I have reviewed the triage vital signs and the nursing notes.  Pertinent labs & imaging results that were available during my care of the patient were reviewed by me and considered in my medical decision making (see chart for details).   77 year old female presents for 1 week history of dysuria, frequency and urgency.  Denies fever.  Urinalysis shows cloudy urine with small hemoglobin, protein, moderate leukocytes, white blood cells and clumps of white blood cells.  Urine to be sent for culture.  Consistent with urinary tract infection.  Patient has allergies to Bactrim and penicillin.  Also does not take cephalosporins.  Will treat at this time with Cipro.  Will amend therapy based on culture report if needed.  Reviewed return and ER precautions for urinary infections.   Final Clinical Impressions(s) / UC Diagnoses   Final diagnoses:  Acute cystitis without hematuria     Discharge Instructions      UTI: Based on either symptoms or urinalysis, you may have a urinary tract infection. We will send the urine for culture and call with results in a few days. Begin antibiotics at this time. Your symptoms should be much improved over the next 2-3 days. Increase rest and fluid intake. If for some reason symptoms are worsening or not improving after a couple of days or the urine culture determines the antibiotics you are taking will not treat the infection, the antibiotics may be changed. Return or go to ER for fever, back pain, worsening urinary pain, discharge, increased blood in urine. May take Tylenol or Motrin OTC for pain relief or consider AZO if no contraindications      ED Prescriptions     Medication Sig Dispense Auth. Provider   ciprofloxacin  (CIPRO) 500 MG tablet Take 1 tablet (500 mg total) by mouth every 12 (twelve) hours for 5 days. 10 tablet Gareth Morgan      PDMP not reviewed this encounter.   Shirlee Latch, PA-C 08/18/23 1853    Shirlee Latch, PA-C 08/18/23 [phone]

## 2023-08-18 NOTE — Discharge Instructions (Signed)

## 2023-08-18 NOTE — ED Triage Notes (Addendum)
Sx x 1 week. Burning-urgency while urinating. Patient is having vaginal itching but doesn't want to swab.

## 2023-08-20 DIAGNOSIS — F411 Generalized anxiety disorder: Secondary | ICD-10-CM | POA: Diagnosis not present

## 2023-08-20 DIAGNOSIS — R202 Paresthesia of skin: Secondary | ICD-10-CM | POA: Diagnosis not present

## 2023-08-20 DIAGNOSIS — R200 Anesthesia of skin: Secondary | ICD-10-CM | POA: Diagnosis not present

## 2023-08-20 DIAGNOSIS — M479 Spondylosis, unspecified: Secondary | ICD-10-CM | POA: Diagnosis not present

## 2023-08-20 DIAGNOSIS — R252 Cramp and spasm: Secondary | ICD-10-CM | POA: Diagnosis not present

## 2023-08-20 DIAGNOSIS — G43909 Migraine, unspecified, not intractable, without status migrainosus: Secondary | ICD-10-CM | POA: Diagnosis not present

## 2023-08-20 LAB — URINE CULTURE: Culture: >=100000 — AB

## 2023-12-01 DIAGNOSIS — E782 Mixed hyperlipidemia: Secondary | ICD-10-CM | POA: Diagnosis not present

## 2023-12-01 DIAGNOSIS — R0789 Other chest pain: Secondary | ICD-10-CM | POA: Diagnosis not present

## 2023-12-01 DIAGNOSIS — I10 Essential (primary) hypertension: Secondary | ICD-10-CM | POA: Diagnosis not present

## 2023-12-22 DIAGNOSIS — H3530 Unspecified macular degeneration: Secondary | ICD-10-CM | POA: Diagnosis not present

## 2023-12-22 DIAGNOSIS — I10 Essential (primary) hypertension: Secondary | ICD-10-CM | POA: Diagnosis not present

## 2023-12-22 DIAGNOSIS — M5442 Lumbago with sciatica, left side: Secondary | ICD-10-CM | POA: Diagnosis not present

## 2023-12-22 DIAGNOSIS — G8929 Other chronic pain: Secondary | ICD-10-CM | POA: Diagnosis not present

## 2023-12-22 DIAGNOSIS — F4323 Adjustment disorder with mixed anxiety and depressed mood: Secondary | ICD-10-CM | POA: Diagnosis not present

## 2023-12-22 DIAGNOSIS — E7800 Pure hypercholesterolemia, unspecified: Secondary | ICD-10-CM | POA: Diagnosis not present

## 2024-01-21 DIAGNOSIS — M545 Low back pain, unspecified: Secondary | ICD-10-CM | POA: Diagnosis not present

## 2024-01-21 DIAGNOSIS — R202 Paresthesia of skin: Secondary | ICD-10-CM | POA: Diagnosis not present

## 2024-01-21 DIAGNOSIS — F411 Generalized anxiety disorder: Secondary | ICD-10-CM | POA: Diagnosis not present

## 2024-01-21 DIAGNOSIS — G8929 Other chronic pain: Secondary | ICD-10-CM | POA: Diagnosis not present

## 2024-01-21 DIAGNOSIS — G479 Sleep disorder, unspecified: Secondary | ICD-10-CM | POA: Diagnosis not present

## 2024-01-21 DIAGNOSIS — G43909 Migraine, unspecified, not intractable, without status migrainosus: Secondary | ICD-10-CM | POA: Diagnosis not present

## 2024-01-21 DIAGNOSIS — R200 Anesthesia of skin: Secondary | ICD-10-CM | POA: Diagnosis not present

## 2024-01-21 DIAGNOSIS — R079 Chest pain, unspecified: Secondary | ICD-10-CM | POA: Diagnosis not present

## 2024-01-28 DIAGNOSIS — M25571 Pain in right ankle and joints of right foot: Secondary | ICD-10-CM | POA: Diagnosis not present

## 2024-01-28 DIAGNOSIS — M5442 Lumbago with sciatica, left side: Secondary | ICD-10-CM | POA: Diagnosis not present

## 2024-01-28 DIAGNOSIS — M533 Sacrococcygeal disorders, not elsewhere classified: Secondary | ICD-10-CM | POA: Diagnosis not present

## 2024-01-28 DIAGNOSIS — G8929 Other chronic pain: Secondary | ICD-10-CM | POA: Diagnosis not present

## 2024-02-04 DIAGNOSIS — R0789 Other chest pain: Secondary | ICD-10-CM | POA: Diagnosis not present

## 2024-02-10 DIAGNOSIS — H5213 Myopia, bilateral: Secondary | ICD-10-CM | POA: Diagnosis not present

## 2024-02-10 DIAGNOSIS — H26493 Other secondary cataract, bilateral: Secondary | ICD-10-CM | POA: Diagnosis not present

## 2024-02-10 DIAGNOSIS — H1045 Other chronic allergic conjunctivitis: Secondary | ICD-10-CM | POA: Diagnosis not present

## 2024-02-10 DIAGNOSIS — H353131 Nonexudative age-related macular degeneration, bilateral, early dry stage: Secondary | ICD-10-CM | POA: Diagnosis not present

## 2024-02-10 DIAGNOSIS — H35033 Hypertensive retinopathy, bilateral: Secondary | ICD-10-CM | POA: Diagnosis not present

## 2024-02-16 ENCOUNTER — Ambulatory Visit: Payer: HMO

## 2024-02-16 DIAGNOSIS — Z1211 Encounter for screening for malignant neoplasm of colon: Secondary | ICD-10-CM

## 2024-02-16 DIAGNOSIS — Z0000 Encounter for general adult medical examination without abnormal findings: Secondary | ICD-10-CM

## 2024-02-16 DIAGNOSIS — Z1231 Encounter for screening mammogram for malignant neoplasm of breast: Secondary | ICD-10-CM

## 2024-02-16 NOTE — Progress Notes (Signed)
 Subjective:   Yvette Rogers is a 78 y.o. who presents for a Medicare Wellness preventive visit.  As a reminder, Annual Wellness Visits don't include a physical exam, and some assessments may be limited, especially if this visit is performed virtually. We may recommend an in-person follow-up visit with your provider if needed.  Visit Complete: Virtual I connected with  Yvette Rogers on 02/16/24 by a audio enabled telemedicine application and verified that I am speaking with the correct person using two identifiers.  Patient Location: Home  Provider Location: Office/Clinic  I discussed the limitations of evaluation and management by telemedicine. The patient expressed understanding and agreed to proceed.  Vital Signs: Because this visit was a virtual/telehealth visit, some criteria may be missing or patient reported. Any vitals not documented were not able to be obtained and vitals that have been documented are patient reported.  VideoDeclined- This patient declined Librarian, academic. Therefore the visit was completed with audio only.  Persons Participating in Visit: Patient.  AWV Questionnaire: No: Patient Medicare AWV questionnaire was not completed prior to this visit.  Cardiac Risk Factors include: advanced age (>24men, >27 women);obesity (BMI >30kg/m2);sedentary lifestyle     Objective:     Today's Vitals   02/16/24 1512  PainSc: 4    There is no height or weight on file to calculate BMI.     02/16/2024    3:16 PM 08/18/2023    6:12 PM 02/11/2023    3:38 PM 11/06/2021    3:45 PM 10/25/2020    3:28 PM 01/31/2019    1:32 PM 10/13/2018   12:48 PM  Advanced Directives  Does Patient Have a Medical Advance Directive? No No No No Yes No No  Type of Agricultural consultant;Living will    Copy of Healthcare Power of Attorney in Chart?     Yes - validated most recent copy scanned in chart (See row information)  No - copy  requested  Would patient like information on creating a medical advance directive? No - Patient declined No - Patient declined  No - Patient declined  Yes (ED - Information included in AVS)     Current Medications (verified) Outpatient Encounter Medications as of 02/16/2024  Medication Sig   acetaminophen  (TYLENOL ) 500 MG tablet Take 500 mg by mouth every 6 (six) hours as needed.   gabapentin (NEURONTIN) 100 MG capsule Take 100 mg by mouth 2 (two) times daily.   meloxicam  (MOBIC ) 15 MG tablet TAKE 1 TABLET(15 MG) BY MOUTH DAILY   traZODone  (DESYREL ) 150 MG tablet TAKE 1 TABLET(150 MG) BY MOUTH AT BEDTIME   magnesium 30 MG tablet Take 30 mg by mouth 2 (two) times daily. (Patient not taking: Reported on 02/16/2024)   No facility-administered encounter medications on file as of 02/16/2024.    Allergies (verified) Bactrim  [sulfamethoxazole -trimethoprim ], Penicillin v potassium, and Penicillins   History: Past Medical History:  Diagnosis Date   Abnormal EKG    Anxiety    Arthritis    Cancer (HCC)    skin ca   Chest pain    Depression    Shingles    Past Surgical History:  Procedure Laterality Date   BUNIONECTOMY Right    CHOLECYSTECTOMY     COLONOSCOPY     COLONOSCOPY WITH PROPOFOL  N/A 10/13/2018   Procedure: COLONOSCOPY WITH PROPOFOL ;  Surgeon: Cassie Click, MD;  Location: [facility] ENDOSCOPY;  Service: Endoscopy;  Laterality: N/A;  EYE SURGERY Bilateral    Cataract removed   FOOT SURGERY     GALLBLADDER SURGERY     SKIN CANCER EXCISION     face   SKIN CANCER EXCISION Left 08/22/2013   left nostril area   subconjunctival     subconjunctival hemorrhage   Family History  Problem Relation Age of Onset   Cancer Mother        pancreatic   Diabetes Mother    Kidney Stones Mother    COPD Father    Diabetes Father    Macular degeneration Father    Breast cancer Sister 89   Cancer Brother        Leukemia   Breast cancer Cousin        mat cousin   Social History    Socioeconomic History   Marital status: Widowed    Spouse name: Not on file   Number of children: 1   Years of education: Not on file   Highest education level: 12th grade  Occupational History   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  Tobacco Use   Smoking status: Never   Smokeless tobacco: Never  Vaping Use   Vaping status: Never Used  Substance and Sexual Activity   Alcohol use: Yes    Comment: rarely every 6-12 months   Drug use: No   Sexual activity: Not on file  Other Topics Concern   Not on file  Social History Narrative   Not on file   Social Drivers of Health   Financial Resource Strain: Low Risk  (02/16/2024)   Overall Financial Resource Strain (CARDIA)    Difficulty of Paying Living Expenses: Not hard at all  Food Insecurity: No Food Insecurity (02/16/2024)   Hunger Vital Sign    Worried About Running Out of Food in the Last Year: Never true    Ran Out of Food in the Last Year: Never true  Transportation Needs: No Transportation Needs (02/16/2024)   PRAPARE - Administrator, Civil Service (Medical): No    Lack of Transportation (Non-Medical): No  Physical Activity: Insufficiently Active (02/16/2024)   Exercise Vital Sign    Days of Exercise per Week: 2 days    Minutes of Exercise per Session: 20 min  Stress: No Stress Concern Present (02/16/2024)   [employer] of Occupational Health - Occupational Stress Questionnaire    Feeling of Stress : Only a little  Social Connections: Moderately Isolated (02/16/2024)   Social Connection and Isolation Panel [NHANES]    Frequency of Communication with Friends and Family: More than three times a week    Frequency of Social Gatherings with Friends and Family: Once a week    Attends Religious Services: More than 4 times per year    Active Member of [employer] or Organizations: No    Attends Banker Meetings: Never    Marital Status: Widowed    Tobacco Counseling Counseling  given: Not Answered    Clinical Intake:  Pre-visit preparation completed: Yes  Pain : 0-10 Pain Score: 4  Pain Type: Chronic pain Pain Location: Hip Pain Orientation: Left Pain Radiating Towards: leg Pain Descriptors / Indicators: Aching, Discomfort, Constant Pain Onset: More than a month ago Pain Frequency: Constant Pain Relieving Factors: gabapentin  Pain Relieving Factors: gabapentin  BMI - recorded: 32.4 Nutritional Status: BMI > 30  Obese Nutritional Risks: None Diabetes: No  Lab Results  Component Value Date   HGBA1C 5.1 11/09/2015  How often do you need to have someone help you when you read instructions, pamphlets, or other written materials from your doctor or pharmacy?: 1 - Never  Interpreter Needed?: No  Information entered by :: Dellie Fergusson, LPN   Activities of Daily Living     02/16/2024    3:16 PM  In your present state of health, do you have any difficulty performing the following activities:  Hearing? 0  Vision? 0  Difficulty concentrating or making decisions? 0  Walking or climbing stairs? 0  Dressing or bathing? 0  Doing errands, shopping? 0  Preparing Food and eating ? N  Using the Toilet? N  In the past six months, have you accidently leaked urine? N  Do you have problems with loss of bowel control? N  Managing your Medications? N  Managing your Finances? N  Housekeeping or managing your Housekeeping? N    Patient Care Team: Mimi Alt, MD as PCP - General (Family Medicine) Julia Oats, OD (Optometry)  I have updated your Care Teams any recent Medical Services you may have received from other providers in the past year.     Assessment:    This is a routine wellness examination for Amand.  Hearing/Vision screen Hearing Screening - Comments:: NO AIDS Vision Screening - Comments:: NO GLASSES- WOODARD   Goals Addressed             This Visit's Progress    Cut out extra servings         Depression  Screen     02/16/2024    3:14 PM 02/11/2023    3:20 PM 05/29/2022    8:35 AM 11/06/2021    3:44 PM 12/10/2020    4:14 PM 10/25/2020    3:23 PM 01/31/2019    1:33 PM  PHQ 2/9 Scores  PHQ - 2 Score 1 2 4 1 4 4 4   PHQ- 9 Score 1 7 17  12 15 19     Fall Risk     02/16/2024    3:16 PM 02/11/2023    3:43 PM 05/29/2022    8:35 AM 11/06/2021    3:46 PM 12/10/2020    4:13 PM  Fall Risk   Falls in the past year? 0 0 1 1 1   Number falls in past yr: 0 0 1 0 1  Injury with Fall? 0 0 1 0 1  Risk for fall due to : No Fall Risks No Fall Risks History of fall(s) History of fall(s) Impaired balance/gait;Impaired mobility  Follow up Falls evaluation completed Education provided;Falls prevention discussed Falls evaluation completed Falls prevention discussed Falls evaluation completed    MEDICARE RISK AT HOME:  Medicare Risk at Home Any stairs in or around the home?: Yes If so, are there any without handrails?: Yes Home free of loose throw rugs in walkways, pet beds, electrical cords, etc?: Yes Adequate lighting in your home to reduce risk of falls?: Yes Life alert?: No Use of a cane, walker or w/c?: No Grab bars in the bathroom?: No Shower chair or bench in shower?: No Elevated toilet seat or a handicapped toilet?: Yes  TIMED UP AND GO:  Was the test performed?  No  Cognitive Function: 6CIT completed        02/16/2024    3:17 PM 02/11/2023    3:41 PM 09/25/2016    9:04 AM  6CIT Screen  What Year? 0 points 0 points 0 points  What month? 0 points 0 points 0 points  What time? 0 points 0 points 0 points  Count back from 20 0 points 0 points 0 points  Months in reverse 0 points 0 points 0 points  Repeat phrase 0 points 0 points 2 points  Total Score 0 points 0 points 2 points    Immunizations Immunization History  Administered Date(s) Administered   Fluad Quad(high Dose 65+) 05/29/2022   Influenza, High Dose Seasonal PF 06/15/2020   Influenza, Mdck, Trivalent,PF 6+ MOS(egg free) 07/30/2023    Influenza, Seasonal, Injecte, Preservative Fre 07/10/2016   Influenza,inj,Quad PF,6+ Mos 07/03/2017, 06/10/2018   Moderna Sars-Covid-2 Vaccination 10/19/2019, 11/17/2019, 07/23/2020, 02/27/2021   PPD Test 12/11/2015, 12/25/2015   Pneumococcal Conjugate-13 09/06/2015   Pneumococcal Polysaccharide-23 09/25/2016   Tdap 02/28/2013   Zoster, Live 02/28/2013    Screening Tests Health Maintenance  Topic Date Due   Zoster Vaccines- Shingrix (1 of 2) 07/29/1998   DTaP/Tdap/Td (2 - Td or Tdap) 03/01/2023   COVID-19 Vaccine (5 - 2024-25 season) 05/17/2023   Colonoscopy  10/14/2023   MAMMOGRAM  01/14/2024   INFLUENZA VACCINE  04/15/2024   Medicare Annual Wellness (AWV)  02/15/2025   DEXA SCAN  01/14/2027   Pneumonia Vaccine 58+ Years old  Completed   Hepatitis C Screening  Completed   HPV VACCINES  Aged Out   Meningococcal B Vaccine  Aged Out    Health Maintenance  Health Maintenance Due  Topic Date Due   Zoster Vaccines- Shingrix (1 of 2) 07/29/1998   DTaP/Tdap/Td (2 - Td or Tdap) 03/01/2023   COVID-19 Vaccine (5 - 2024-25 season) 05/17/2023   Colonoscopy  10/14/2023   MAMMOGRAM  01/14/2024   Health Maintenance Items Addressed: Mammogram ordered; COLONOSCOPY REFERRAL SENT; BDS UP TO DATE; UP TO DATE ON SHOTS EXCEPT SHINGRIX, TDAP, COVIDS  Additional Screening:  Vision Screening: Recommended annual ophthalmology exams for early detection of glaucoma and other disorders of the eye. Would you like a referral to an eye doctor? No    Dental Screening: Recommended annual dental exams for proper oral hygiene  Community Resource Referral / Chronic Care Management: CRR required this visit?  No   CCM required this visit?  No   Plan:    I have personally reviewed and noted the following in the patient's chart:   Medical and social history Use of alcohol, tobacco or illicit drugs  Current medications and supplements including opioid prescriptions. Patient is not currently  taking opioid prescriptions. Functional ability and status Nutritional status Physical activity Advanced directives List of other physicians Hospitalizations, surgeries, and ER visits in previous 12 months Vitals Screenings to include cognitive, depression, and falls Referrals and appointments  In addition, I have reviewed and discussed with patient certain preventive protocols, quality metrics, and best practice recommendations. A written personalized care plan for preventive services as well as general preventive health recommendations were provided to patient.   Pinky Bright, LPN   0/05/8118   After Visit Summary: (MyChart) Due to this being a telephonic visit, the after visit summary with patients personalized plan was offered to patient via MyChart   Notes: MAMMOGRAM & COLONOSCOPY ORDERED

## 2024-02-16 NOTE — Patient Instructions (Signed)
 Yvette Rogers , Thank you for taking time out of your busy schedule to complete your Annual Wellness Visit with me. I enjoyed our conversation and look forward to speaking with you again next year. I, as well as your care team,  appreciate your ongoing commitment to your health goals. Please review the following plan we discussed and let me know if I can assist you in the future.  Referrals: If you haven't heard from the office you've been referred to, please reach out to them at the phone provided.   You have an order for:  []   2D Mammogram  [x]   3D Mammogram  []   Bone Density     Please call for appointment:  [facility]  [address] [place] [address] [phone] [facility] [address] [place], [place] [address] [phone] [facility] [address] Tracey Friday [place], [place] [address] [phone]   Make sure to wear two-piece clothing.  No lotions, powders, or deodorants the day of the appointment. Make sure to bring picture ID and insurance card.  Bring list of medications you are currently taking including any supplements.   Schedule your [place] screening mammogram through MyChart!   Log into your MyChart account.  Go to 'Visit' (or 'Appointments' if on mobile App) --> Schedule an Appointment  Under 'Select a Reason for Visit' choose the Mammogram Screening option.  Complete the pre-visit questions and select the time and place that best fits your schedule.   Follow up Visits: Next Medicare AWV with our clinical staff:    Have you seen your provider in the last 6 months (3 months if uncontrolled diabetes)? Yes  Clinician Recommendations:  Aim for 30 minutes of exercise or brisk walking, 6-8 glasses of water, and 5 servings of fruits and vegetables each day. TAKE CARE!      This is a list of the screening recommended for you and due dates:   Health Maintenance  Topic Date Due   Zoster (Shingles) Vaccine (1 of 2) 07/29/1998   DTaP/Tdap/Td vaccine (2 - Td or Tdap) 03/01/2023   COVID-19 Vaccine (5 - 2024-25 season) 05/17/2023   Colon Cancer Screening  10/14/2023   Mammogram  01/14/2024   Flu Shot  04/15/2024   Medicare Annual Wellness Visit  02/15/2025   DEXA scan (bone density measurement)  01/14/2027   Pneumonia Vaccine  Completed   Hepatitis C Screening  Completed   HPV Vaccine  Aged Out   Meningitis B Vaccine  Aged Out    Advanced directives: (ACP Link)Information on Advanced Care Planning can be found at Surveyor, minerals Health Care Directives Advance Health Care Directives. [email or web address]  Advance Care Planning is important because it:  [x]  Makes sure you receive the medical care that is consistent with your values, goals, and preferences  [x]  It provides guidance to your family and loved ones and reduces their decisional burden about whether or not they are making the right decisions based on your wishes.  Follow the link provided in your after visit summary or read over the paperwork we have mailed to you to help you started getting your Advance Directives in place. If you need assistance in completing these, please reach out to us  so that we can help you!

## 2024-02-23 DIAGNOSIS — F4323 Adjustment disorder with mixed anxiety and depressed mood: Secondary | ICD-10-CM | POA: Diagnosis not present

## 2024-02-23 DIAGNOSIS — H3530 Unspecified macular degeneration: Secondary | ICD-10-CM | POA: Diagnosis not present

## 2024-02-23 DIAGNOSIS — I890 Lymphedema, not elsewhere classified: Secondary | ICD-10-CM | POA: Diagnosis not present

## 2024-02-23 DIAGNOSIS — F419 Anxiety disorder, unspecified: Secondary | ICD-10-CM | POA: Diagnosis not present

## 2024-03-02 ENCOUNTER — Other Ambulatory Visit: Payer: Self-pay

## 2024-03-02 ENCOUNTER — Telehealth: Payer: Self-pay

## 2024-03-02 DIAGNOSIS — Z860100 Personal history of colon polyps, unspecified: Secondary | ICD-10-CM

## 2024-03-02 MED ORDER — NA SULFATE-K SULFATE-MG SULF 17.5-3.13-1.6 GM/177ML PO SOLN: 1.0000 | Freq: Once | ORAL | 0 refills | Status: AC

## 2024-03-02 NOTE — Telephone Encounter (Signed)
 Gastroenterology Pre-Procedure Review  Request Date: 04/14/24 Requesting Physician: Dr. Ole Berkeley  PATIENT REVIEW QUESTIONS: The patient responded to the following health history questions as indicated:    1. Are you having any GI issues? no 2. Do you have a personal history of Polyps? yes (last colonoscopy performed by Dr. Dawne Euler 10/13/18) 3. Do you have a family history of Colon Cancer or Polyps? yes (brother colon polyps) 4. Diabetes Mellitus? no 5. Joint replacements in the past 12 months?no 6. Major health problems in the past 3 months?no 7. Any artificial heart valves, MVP, or defibrillator?no    MEDICATIONS & ALLERGIES:    Patient reports the following regarding taking any anticoagulation/antiplatelet therapy:   Plavix, Coumadin, Eliquis, Xarelto, Lovenox, Pradaxa, Brilinta, or Effient? no Aspirin? no  Patient confirms/reports the following medications:  Current Outpatient Medications  Medication Sig Dispense Refill   Na Sulfate-K Sulfate-Mg Sulfate concentrate (SUPREP) 17.5-3.13-1.6 GM/177ML SOLN Take 1 kit (354 mLs total) by mouth once for 1 dose. 354 mL 0   acetaminophen  (TYLENOL ) 500 MG tablet Take 500 mg by mouth every 6 (six) hours as needed.     gabapentin (NEURONTIN) 100 MG capsule Take 100 mg by mouth 2 (two) times daily.     magnesium 30 MG tablet Take 30 mg by mouth 2 (two) times daily. (Patient not taking: Reported on 02/16/2024)     meloxicam  (MOBIC ) 15 MG tablet TAKE 1 TABLET(15 MG) BY MOUTH DAILY 90 tablet 0   traZODone  (DESYREL ) 150 MG tablet TAKE 1 TABLET(150 MG) BY MOUTH AT BEDTIME 90 tablet 3   No current facility-administered medications for this visit.    Patient confirms/reports the following allergies:  Allergies  Allergen Reactions   Bactrim  [Sulfamethoxazole -Trimethoprim ] Swelling   Penicillin V Potassium Hives   Penicillins Hives    No orders of the defined types were placed in this encounter.   AUTHORIZATION INFORMATION Primary  Insurance: 1D#: Group #:  Secondary Insurance: 1D#: Group #:  SCHEDULE INFORMATION: Date: 04/14/24 Time: Location: [facility]

## 2024-03-21 DIAGNOSIS — Z803 Family history of malignant neoplasm of breast: Secondary | ICD-10-CM | POA: Diagnosis not present

## 2024-03-21 DIAGNOSIS — K219 Gastro-esophageal reflux disease without esophagitis: Secondary | ICD-10-CM | POA: Diagnosis not present

## 2024-03-21 DIAGNOSIS — N644 Mastodynia: Secondary | ICD-10-CM | POA: Diagnosis not present

## 2024-03-22 ENCOUNTER — Other Ambulatory Visit: Payer: Self-pay | Admitting: Nurse Practitioner

## 2024-03-22 DIAGNOSIS — N644 Mastodynia: Secondary | ICD-10-CM

## 2024-03-22 DIAGNOSIS — Z803 Family history of malignant neoplasm of breast: Secondary | ICD-10-CM

## 2024-03-28 ENCOUNTER — Ambulatory Visit
Admission: RE | Admit: 2024-03-28 | Discharge: 2024-03-28 | Disposition: A | Discharge status: Home / Self Care | Source: Ambulatory Visit | Attending: Nurse Practitioner | Admitting: Nurse Practitioner

## 2024-03-28 DIAGNOSIS — N6002 Solitary cyst of left breast: Secondary | ICD-10-CM | POA: Diagnosis not present

## 2024-03-28 DIAGNOSIS — R92323 Mammographic fibroglandular density, bilateral breasts: Secondary | ICD-10-CM | POA: Diagnosis not present

## 2024-03-28 DIAGNOSIS — Z803 Family history of malignant neoplasm of breast: Secondary | ICD-10-CM

## 2024-03-28 DIAGNOSIS — N644 Mastodynia: Secondary | ICD-10-CM

## 2024-03-28 DIAGNOSIS — N6325 Unspecified lump in the left breast, overlapping quadrants: Secondary | ICD-10-CM | POA: Diagnosis not present

## 2024-03-30 DIAGNOSIS — R0789 Other chest pain: Secondary | ICD-10-CM | POA: Diagnosis not present

## 2024-04-14 ENCOUNTER — Encounter
Admission: RE | Disposition: A | Discharge status: Home / Self Care | Payer: Self-pay | Source: Home / Self Care | Attending: Gastroenterology

## 2024-04-14 ENCOUNTER — Ambulatory Visit
Admission: RE | Admit: 2024-04-14 | Discharge: 2024-04-14 | Disposition: A | Discharge status: Home / Self Care | Attending: Gastroenterology | Admitting: Gastroenterology

## 2024-04-14 ENCOUNTER — Ambulatory Visit: Admitting: Certified Registered"

## 2024-04-14 ENCOUNTER — Other Ambulatory Visit: Payer: Self-pay

## 2024-04-14 ENCOUNTER — Encounter: Payer: Self-pay | Admitting: Gastroenterology

## 2024-04-14 DIAGNOSIS — Z6832 Body mass index (BMI) 32.0-32.9, adult: Secondary | ICD-10-CM | POA: Insufficient documentation

## 2024-04-14 DIAGNOSIS — Z860100 Personal history of colon polyps, unspecified: Secondary | ICD-10-CM

## 2024-04-14 DIAGNOSIS — E66813 Obesity, class 3: Secondary | ICD-10-CM | POA: Diagnosis not present

## 2024-04-14 DIAGNOSIS — Z1211 Encounter for screening for malignant neoplasm of colon: Secondary | ICD-10-CM | POA: Diagnosis not present

## 2024-04-14 DIAGNOSIS — Z860101 Personal history of adenomatous and serrated colon polyps: Secondary | ICD-10-CM | POA: Diagnosis not present

## 2024-04-14 DIAGNOSIS — F419 Anxiety disorder, unspecified: Secondary | ICD-10-CM | POA: Insufficient documentation

## 2024-04-14 HISTORY — PX: COLONOSCOPY: SHX5424

## 2024-04-14 SURGERY — COLONOSCOPY
Anesthesia: General

## 2024-04-14 MED ORDER — PROPOFOL 500 MG/50ML IV EMUL: INTRAVENOUS | Status: DC | PRN

## 2024-04-14 MED ORDER — SODIUM CHLORIDE 0.9 % IV SOLN: INTRAVENOUS | Status: DC

## 2024-04-14 MED ORDER — LIDOCAINE HCL (CARDIAC) PF 100 MG/5ML IV SOSY: PREFILLED_SYRINGE | INTRAVENOUS | Status: DC | PRN

## 2024-04-14 MED ORDER — LIDOCAINE HCL (CARDIAC) PF 100 MG/5ML IV SOSY
PREFILLED_SYRINGE | INTRAVENOUS | Status: DC | PRN
  Administered 2024-04-14 (×2): 50 mg via INTRAVENOUS

## 2024-04-14 MED ORDER — PROPOFOL 500 MG/50ML IV EMUL
INTRAVENOUS | Status: DC | PRN
  Administered 2024-04-14: 125 ug/kg/min via INTRAVENOUS
  Administered 2024-04-14 (×2): 25 mg via INTRAVENOUS

## 2024-04-14 NOTE — H&P (Signed)
 Rogelia Copping, MD [facility] [address] [place], [place] [address] [phone] Fax : [phone]  Primary Care Physician:  Bertrum Charlie CROME, MD Primary Gastroenterologist:  Dr. Copping  Pre-Procedure History & Physical: HPI:  Yvette Rogers is a 78 y.o. female is here for an colonoscopy.   Past Medical History:  Diagnosis Date   Abnormal EKG    Anxiety    Arthritis    Cancer (HCC)    skin ca   Chest pain    Depression    Shingles     Past Surgical History:  Procedure Laterality Date   BUNIONECTOMY Right    CHOLECYSTECTOMY     COLONOSCOPY     COLONOSCOPY WITH PROPOFOL  N/A 10/13/2018   Procedure: COLONOSCOPY WITH PROPOFOL ;  Surgeon: Viktoria Lamar DASEN, MD;  Location: [facility] ENDOSCOPY;  Service: Endoscopy;  Laterality: N/A;   EYE SURGERY Bilateral    Cataract removed   FOOT SURGERY     GALLBLADDER SURGERY     SKIN CANCER EXCISION     face   SKIN CANCER EXCISION Left 08/22/2013   left nostril area   subconjunctival     subconjunctival hemorrhage    Prior to Admission medications   Medication Sig Start Date End Date Taking? Authorizing Provider  gabapentin (NEURONTIN) 100 MG capsule Take 100 mg by mouth 2 (two) times daily.   Yes [provider]  magnesium 30 MG tablet Take 30 mg by mouth 2 (two) times daily.   Yes [provider]  meloxicam  (MOBIC ) 15 MG tablet TAKE 1 TABLET(15 MG) BY MOUTH DAILY 07/02/21  Yes Bertrum Charlie CROME, MD  traZODone  (DESYREL ) 150 MG tablet TAKE 1 TABLET(150 MG) BY MOUTH AT BEDTIME 05/29/22  Yes Bertrum Charlie CROME, MD  acetaminophen  (TYLENOL ) 500 MG tablet Take 500 mg by mouth every 6 (six) hours as needed.    [provider]    Allergies as of 03/02/2024 - Review Complete 02/16/2024  Allergen Reaction Noted   Bactrim  [sulfamethoxazole -trimethoprim ] Swelling 07/14/2017   Penicillin v potassium Hives 01/24/2015   Penicillins Hives 10/27/2013    Family History  Problem Relation Age of Onset   Cancer  Mother        pancreatic   Diabetes Mother    Kidney Stones Mother    COPD Father    Diabetes Father    Macular degeneration Father    Breast cancer Sister 89   Cancer Brother        Leukemia   Breast cancer Cousin        mat cousin    Social History   Socioeconomic History   Marital status: Widowed    Spouse name: Not on file   Number of children: 1   Years of education: Not on file   Highest education level: 12th grade  Occupational History   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  Tobacco Use   Smoking status: Never   Smokeless tobacco: Never  Vaping Use   Vaping status: Never Used  Substance and Sexual Activity   Alcohol use: Yes    Comment: rarely every 6-12 months   Drug use: No   Sexual activity: Not on file  Other Topics Concern   Not on file  Social History Narrative   Not on file   Social Drivers of Health   Financial Resource Strain: Low Risk  (02/16/2024)   Overall Financial Resource Strain (CARDIA)    Difficulty of Paying Living Expenses: Not  hard at all  Food Insecurity: No Food Insecurity (02/16/2024)   Hunger Vital Sign    Worried About Running Out of Food in the Last Year: Never true    Ran Out of Food in the Last Year: Never true  Transportation Needs: No Transportation Needs (02/16/2024)   PRAPARE - Administrator, Civil Service (Medical): No    Lack of Transportation (Non-Medical): No  Physical Activity: Insufficiently Active (02/16/2024)   Exercise Vital Sign    Days of Exercise per Week: 2 days    Minutes of Exercise per Session: 20 min  Stress: No Stress Concern Present (02/16/2024)   [employer] of Occupational Health - Occupational Stress Questionnaire    Feeling of Stress : Only a little  Social Connections: Moderately Isolated (02/16/2024)   Social Connection and Isolation Panel    Frequency of Communication with Friends and Family: More than three times a week    Frequency of Social Gatherings  with Friends and Family: Once a week    Attends Religious Services: More than 4 times per year    Active Member of [employer] or Organizations: No    Attends Banker Meetings: Never    Marital Status: Widowed  Intimate Partner Violence: Not At Risk (02/16/2024)   Humiliation, Afraid, Rape, and Kick questionnaire    Fear of Current or Ex-Partner: No    Emotionally Abused: No    Physically Abused: No    Sexually Abused: No    Review of Systems: See HPI, otherwise negative ROS  Physical Exam: BP (!) 146/78   Pulse 93   Temp (!) 97 F (36.1 C) (Temporal)   Resp 20   Ht 5' 5 (1.651 m)   Wt 87.8 kg   LMP  (LMP Unknown)   SpO2 96%   BMI 32.22 kg/m  General:   Alert,  pleasant and cooperative in NAD Head:  Normocephalic and atraumatic. Neck:  Supple; no masses or thyromegaly. Lungs:  Clear throughout to auscultation.    Heart:  Regular rate and rhythm. Abdomen:  Soft, nontender and nondistended. Normal bowel sounds, without guarding, and without rebound.   Neurologic:  Alert and  oriented x4;  grossly normal neurologically.  Impression/Plan: Yvette Rogers is here for an colonoscopy to be performed for a history of adenomatous polyps on 2020   Risks, benefits, limitations, and alternatives regarding  colonoscopy have been reviewed with the patient.  Questions have been answered.  All parties agreeable.   Rogelia Copping, MD  04/14/2024, 9:22 AM

## 2024-04-14 NOTE — Anesthesia Procedure Notes (Signed)
 Date/Time: 04/14/2024 10:01 AM  Performed by: Norleen Alberta HERO., CRNAPre-anesthesia Checklist: Patient identified, Emergency Drugs available, Suction available, Timeout performed and Patient being monitored Patient Re-evaluated:Patient Re-evaluated prior to induction Oxygen Delivery Method: Nasal cannula Preoxygenation: Pre-oxygenation with 100% oxygen Induction Type: IV induction

## 2024-04-14 NOTE — Anesthesia Postprocedure Evaluation (Signed)
 Anesthesia Post Note  Patient: Yvette Rogers  Procedure(s) Performed: COLONOSCOPY  Patient location during evaluation: PACU Anesthesia Type: General Level of consciousness: awake Pain management: satisfactory to patient Vital Signs Assessment: post-procedure vital signs reviewed and stable Respiratory status: spontaneous breathing Cardiovascular status: stable Anesthetic complications: no   There were no known notable events for this encounter.   Last Vitals:  Vitals:   04/14/24 1038 04/14/24 1050  BP: (!) 101/90 107/80  Pulse: 77   Resp: 20 12  Temp:    SpO2: 99%     Last Pain:  Vitals:   04/14/24 1028  TempSrc: Temporal  PainSc: 0-No pain                 VAN STAVEREN,Decoda Van

## 2024-04-14 NOTE — Anesthesia Preprocedure Evaluation (Signed)
 Anesthesia Evaluation  Patient identified by MRN, date of birth, ID band Patient awake    Reviewed: Allergy & Precautions, NPO status , Patient's Chart, lab work & pertinent test results  Airway Mallampati: II  TM Distance: >3 FB Neck ROM: full    Dental  (+) Teeth Intact   Pulmonary neg pulmonary ROS   Pulmonary exam normal        Cardiovascular Exercise Tolerance: Good negative cardio ROS Normal cardiovascular exam Rhythm:Regular Rate:Normal     Neuro/Psych  Headaches  Anxiety     negative neurological ROS  negative psych ROS   GI/Hepatic negative GI ROS, Neg liver ROS,,,  Endo/Other  negative endocrine ROS  Class 3 obesity  Renal/GU negative Renal ROS  negative genitourinary   Musculoskeletal   Abdominal   Peds negative pediatric ROS (+)  Hematology negative hematology ROS (+)   Anesthesia Other Findings Past Medical History: No date: Abnormal EKG No date: Anxiety No date: Arthritis No date: Cancer (HCC)     Comment:  skin ca No date: Chest pain No date: Depression No date: Shingles  Past Surgical History: No date: BUNIONECTOMY; Right No date: CHOLECYSTECTOMY No date: COLONOSCOPY 10/13/2018: COLONOSCOPY WITH PROPOFOL ; N/A     Comment:  Procedure: COLONOSCOPY WITH PROPOFOL ;  Surgeon: Viktoria Lamar DASEN, MD;  Location: [facility] ENDOSCOPY;  Service:               Endoscopy;  Laterality: N/A; No date: EYE SURGERY; Bilateral     Comment:  Cataract removed No date: FOOT SURGERY No date: GALLBLADDER SURGERY No date: SKIN CANCER EXCISION     Comment:  face 08/22/2013: SKIN CANCER EXCISION; Left     Comment:  left nostril area No date: subconjunctival     Comment:  subconjunctival hemorrhage  BMI    Body Mass Index: 32.22 kg/m      Reproductive/Obstetrics negative OB ROS                              Anesthesia Physical Anesthesia Plan  ASA: 2  Anesthesia  Plan: General   Post-op Pain Management:    Induction: Intravenous  PONV Risk Score and Plan: Propofol  infusion and TIVA  Airway Management Planned: Natural Airway and Nasal Cannula  Additional Equipment:   Intra-op Plan:   Post-operative Plan:   Informed Consent: I have reviewed the patients History and Physical, chart, labs and discussed the procedure including the risks, benefits and alternatives for the proposed anesthesia with the patient or authorized representative who has indicated his/her understanding and acceptance.     Dental Advisory Given  Plan Discussed with: CRNA  Anesthesia Plan Comments:         Anesthesia Quick Evaluation

## 2024-04-14 NOTE — Transfer of Care (Signed)
 Immediate Anesthesia Transfer of Care Note  Patient: Yvette Rogers  Procedure(s) Performed: COLONOSCOPY  Patient Location: PACU  Anesthesia Type:General  Level of Consciousness: awake and drowsy  Airway & Oxygen Therapy: Patient Spontanous Breathing  Post-op Assessment: Report given to RN and Post -op Vital signs reviewed and stable  Post vital signs: stable  Last Vitals:  Vitals Value Taken Time  BP 133/67 04/14/24 10:28  Temp 35.6 C 04/14/24 10:28  Pulse 81 04/14/24 10:28  Resp 20 04/14/24 10:28  SpO2 100 % 04/14/24 10:28  Vitals shown include unfiled device data.  Last Pain:  Vitals:   04/14/24 1028  TempSrc: Temporal  PainSc:          Complications: No notable events documented.

## 2024-04-14 NOTE — Op Note (Signed)
 [facility] Gastroenterology Patient Name: Yvette Rogers Procedure Date: 04/14/2024 10:01 AM MRN: [id number] Account #: [id number] Date of Birth: May 29, 1946 Admit Type: Outpatient Age: 78 Room: [facility] ENDO ROOM 4 Gender: Female Note Status: Finalized Instrument Name: Arvis [id number] Procedure:             Colonoscopy Indications:           High risk colon cancer surveillance: Personal history                         of colonic polyps Providers:             Rogelia Copping MD, MD Medicines:             Propofol  per Anesthesia Complications:         No immediate complications. Procedure:             Pre-Anesthesia Assessment:                        - Prior to the procedure, a History and Physical was                         performed, and patient medications and allergies were                         reviewed. The patient's tolerance of previous                         anesthesia was also reviewed. The risks and benefits                         of the procedure and the sedation options and risks                         were discussed with the patient. All questions were                         answered, and informed consent was obtained. Prior                         Anticoagulants: The patient has taken no anticoagulant                         or antiplatelet agents. ASA Grade Assessment: II - A                         patient with mild systemic disease. After reviewing                         the risks and benefits, the patient was deemed in                         satisfactory condition to undergo the procedure.                        After obtaining informed consent, the colonoscope was                         passed under direct vision. Throughout  the procedure,                         the patient's blood pressure, pulse, and oxygen                         saturations were monitored continuously. The                         Colonoscope was introduced through the  anus and                         advanced to the the cecum, identified by appendiceal                         orifice and ileocecal valve. The colonoscopy was                         performed without difficulty. The patient tolerated                         the procedure well. The quality of the bowel                         preparation was excellent. Findings:      The perianal and digital rectal examinations were normal.      The colon (entire examined portion) appeared normal. Impression:            - The entire examined colon is normal.                        - No specimens collected. Recommendation:        - Discharge patient to home.                        - Resume previous diet.                        - Continue present medications.                        - Repeat colonoscopy is not recommended for                         surveillance. Procedure Code(s):     --- Professional ---                        [phone], Colonoscopy, flexible; diagnostic, including                         collection of specimen(s) by brushing or washing, when                         performed (separate procedure) Diagnosis Code(s):     --- Professional ---                        Z86.010, Personal history of colonic polyps CPT copyright 2022 [employer]. All rights reserved. The codes documented in this report are preliminary and upon coder review may  be revised to  meet current compliance requirements. Rogelia Copping MD, MD 04/14/2024 10:27:39 AM This report has been signed electronically. Number of Addenda: 0 Note Initiated On: 04/14/2024 10:01 AM Scope Withdrawal Time: 0 hours 10 minutes 25 seconds  Total Procedure Duration: 0 hours 17 minutes 37 seconds  Estimated Blood Loss:  Estimated blood loss: none.      [facility]

## 2024-05-26 DIAGNOSIS — I10 Essential (primary) hypertension: Secondary | ICD-10-CM | POA: Diagnosis not present

## 2024-05-26 DIAGNOSIS — Z1211 Encounter for screening for malignant neoplasm of colon: Secondary | ICD-10-CM | POA: Diagnosis not present

## 2024-05-26 DIAGNOSIS — H3530 Unspecified macular degeneration: Secondary | ICD-10-CM | POA: Diagnosis not present

## 2024-05-26 DIAGNOSIS — I890 Lymphedema, not elsewhere classified: Secondary | ICD-10-CM | POA: Diagnosis not present

## 2024-05-26 DIAGNOSIS — E7800 Pure hypercholesterolemia, unspecified: Secondary | ICD-10-CM | POA: Diagnosis not present

## 2024-05-26 DIAGNOSIS — K219 Gastro-esophageal reflux disease without esophagitis: Secondary | ICD-10-CM | POA: Diagnosis not present

## 2024-05-26 DIAGNOSIS — F4323 Adjustment disorder with mixed anxiety and depressed mood: Secondary | ICD-10-CM | POA: Diagnosis not present

## 2024-05-26 DIAGNOSIS — Z1331 Encounter for screening for depression: Secondary | ICD-10-CM | POA: Diagnosis not present

## 2024-05-26 DIAGNOSIS — Z0000 Encounter for general adult medical examination without abnormal findings: Secondary | ICD-10-CM | POA: Diagnosis not present

## 2024-06-21 DIAGNOSIS — L821 Other seborrheic keratosis: Secondary | ICD-10-CM | POA: Diagnosis not present

## 2024-06-21 DIAGNOSIS — L570 Actinic keratosis: Secondary | ICD-10-CM | POA: Diagnosis not present

## 2024-09-21 NOTE — Progress Notes (Signed)
 Yvette Rogers                                          MRN: [id number]   09/21/2024   The [facility] Quality Team Specialist reviewed this patient medical record for the purposes of chart review for care gap closure. The following were reviewed: chart review for care gap closure-controlling blood pressure.    [facility] Quality Team

## 2025-02-22 ENCOUNTER — Ambulatory Visit
